# Patient Record
Sex: Female | Born: 1980 | Race: White | Hispanic: No | Marital: Single
Health system: Midwestern US, Community
[De-identification: ages and names within clinical notes are randomized; demographics above are authoritative.]

## PROBLEM LIST (undated history)

## (undated) DIAGNOSIS — E669 Obesity, unspecified: Secondary | ICD-10-CM

## (undated) DIAGNOSIS — F319 Bipolar disorder, unspecified: Secondary | ICD-10-CM

## (undated) DIAGNOSIS — K449 Diaphragmatic hernia without obstruction or gangrene: Secondary | ICD-10-CM

---

## 2014-07-05 ENCOUNTER — Inpatient Hospital Stay
Admit: 2014-07-05 | Discharge: 2014-07-07 | Disposition: A | Discharge status: Other Acute Inpatient Hospital | Payer: Self-pay | Attending: Internal Medicine | Admitting: Internal Medicine

## 2014-07-05 DIAGNOSIS — T391X2A Poisoning by 4-Aminophenol derivatives, intentional self-harm, initial encounter: Secondary | ICD-10-CM

## 2014-07-05 LAB — URINALYSIS W/MICROSCOPIC
Bacteria: NEGATIVE /hpf
Bilirubin: NEGATIVE
Blood: NEGATIVE
Glucose: 100 mg/dL — AB
Ketone: 15 mg/dL — AB
Leukocyte Esterase: NEGATIVE
Nitrites: NEGATIVE
Specific gravity: >1.03 — ABNORMAL HIGH (ref 1.003–1.030)
Urobilinogen: 0.2 EU/dL (ref 0.2–1.0)
pH (UA): 5 (ref 5.0–8.0)

## 2014-07-05 LAB — METABOLIC PANEL, COMPREHENSIVE
A-G Ratio: 0.9 — ABNORMAL LOW (ref 1.1–2.2)
ALT (SGPT): 19 U/L (ref 12–78)
AST (SGOT): 12 U/L — ABNORMAL LOW (ref 15–37)
Albumin: 3.7 g/dL (ref 3.5–5.0)
Alk. phosphatase: 97 U/L (ref 45–117)
Anion gap: 11 mmol/L (ref 5–15)
BUN/Creatinine ratio: 15 (ref 12–20)
BUN: 12 MG/DL (ref 6–20)
Bilirubin, total: 0.4 MG/DL (ref 0.2–1.0)
CO2: 22 mmol/L (ref 21–32)
Calcium: 8.4 MG/DL — ABNORMAL LOW (ref 8.5–10.1)
Chloride: 105 mmol/L (ref 97–108)
Creatinine: 0.78 MG/DL (ref 0.55–1.02)
GFR est AA: >60 mL/min/{1.73_m2} (ref >60)
GFR est non-AA: >60 mL/min/{1.73_m2} (ref >60)
Globulin: 4.1 g/dL — ABNORMAL HIGH (ref 2.0–4.0)
Glucose: 186 mg/dL — ABNORMAL HIGH (ref 65–100)
Potassium: 3.4 mmol/L — ABNORMAL LOW (ref 3.5–5.1)
Protein, total: 7.8 g/dL (ref 6.4–8.2)
Sodium: 138 mmol/L (ref 136–145)

## 2014-07-05 LAB — DRUG SCREEN, URINE
AMPHETAMINES: NEGATIVE
BARBITURATES: NEGATIVE
BENZODIAZEPINES: NEGATIVE
COCAINE: NEGATIVE
METHADONE: NEGATIVE
OPIATES: NEGATIVE
PCP(PHENCYCLIDINE): NEGATIVE
THC (TH-CANNABINOL): POSITIVE — AB

## 2014-07-05 LAB — CBC WITH AUTOMATED DIFF
ABS. BASOPHILS: 0 10*3/uL (ref 0.0–0.1)
ABS. EOSINOPHILS: 0.1 10*3/uL (ref 0.0–0.4)
ABS. LYMPHOCYTES: 2 10*3/uL (ref 0.8–3.5)
ABS. MONOCYTES: 0.5 10*3/uL (ref 0.0–1.0)
ABS. NEUTROPHILS: 7 10*3/uL (ref 1.8–8.0)
BASOPHILS: 0 % (ref 0–1)
EOSINOPHILS: 1 % (ref 0–7)
HCT: 37.9 % (ref 35.0–47.0)
HGB: 12.2 g/dL (ref 11.5–16.0)
LYMPHOCYTES: 21 % (ref 12–49)
MCH: 25.8 PG — ABNORMAL LOW (ref 26.0–34.0)
MCHC: 32.2 g/dL (ref 30.0–36.5)
MCV: 80.1 FL (ref 80.0–99.0)
MONOCYTES: 5 % (ref 5–13)
NEUTROPHILS: 73 % (ref 32–75)
PLATELET: 179 10*3/uL (ref 150–400)
RBC: 4.73 M/uL (ref 3.80–5.20)
RDW: 13.7 % (ref 11.5–14.5)
WBC: 9.5 10*3/uL (ref 3.6–11.0)

## 2014-07-05 LAB — EKG, 12 LEAD, INITIAL
Atrial Rate: 87 {beats}/min
Calculated P Axis: 71 degrees
Calculated R Axis: 54 degrees
Calculated T Axis: 48 degrees
Diagnosis: NORMAL
P-R Interval: 142 ms
Q-T Interval: 394 ms
QRS Duration: 78 ms
QTC Calculation (Bezet): 474 ms
Ventricular Rate: 87 {beats}/min

## 2014-07-05 LAB — CK W/ CKMB & INDEX
CK - MB: <0.5 NG/ML — ABNORMAL LOW (ref 0.5–3.6)
CK: 77 U/L (ref 26–192)

## 2014-07-05 LAB — SED RATE (ESR): Sed rate, automated: 29 mm/hr — ABNORMAL HIGH (ref 0–20)

## 2014-07-05 LAB — MAGNESIUM: Magnesium: 1.5 mg/dL — ABNORMAL LOW (ref 1.6–2.4)

## 2014-07-05 LAB — ACETAMINOPHEN
Acetaminophen level: 227 ug/mL — CR (ref 10–30)
Acetaminophen level: 54 ug/mL — ABNORMAL HIGH (ref 10–30)

## 2014-07-05 LAB — PROTHROMBIN TIME + INR
INR: 1.1 (ref 0.9–1.1)
Prothrombin time: 10.7 s (ref 9.0–11.1)

## 2014-07-05 LAB — HCG URINE, QL. - POC: Pregnancy test,urine (POC): NEGATIVE

## 2014-07-05 LAB — TROPONIN I: Troponin-I, Qt.: <0.04 ng/mL (ref <0.05)

## 2014-07-05 LAB — ETHYL ALCOHOL: ALCOHOL(ETHYL),SERUM: <10 MG/DL (ref <10)

## 2014-07-05 LAB — SALICYLATE: Salicylate level: 1.8 MG/DL — ABNORMAL LOW (ref 2.8–20.0)

## 2014-07-05 LAB — TSH 3RD GENERATION: TSH: 0.79 u[IU]/mL (ref 0.36–3.74)

## 2014-07-05 MED ORDER — ACTIVATED CHARCOAL 25 GRAM/120 ML ORAL SUSP
25 gram/120 mL | ORAL | Status: AC
Start: 2014-07-05 — End: 2014-07-05
  Administered 2014-07-05: 07:00:00 via ORAL

## 2014-07-05 MED ORDER — SODIUM CHLORIDE 0.9% BOLUS IV
0.9 % | INTRAVENOUS | Status: AC
Start: 2014-07-05 — End: 2014-07-05
  Administered 2014-07-05: 08:00:00 via INTRAVENOUS

## 2014-07-05 MED ORDER — NALOXONE 0.4 MG/ML INJECTION
0.4 mg/mL | INTRAMUSCULAR | Status: DC | PRN
Start: 2014-07-05 — End: 2014-07-06

## 2014-07-05 MED ORDER — PANTOPRAZOLE 40 MG IV SOLR
40 mg | Freq: Once | INTRAVENOUS | Status: AC
Start: 2014-07-05 — End: 2014-07-05
  Administered 2014-07-05: 13:00:00 via INTRAVENOUS

## 2014-07-05 MED ORDER — ONDANSETRON (PF) 4 MG/2 ML INJECTION
4 mg/2 mL | INTRAMUSCULAR | Status: AC
Start: 2014-07-05 — End: 2014-07-05
  Administered 2014-07-05: 09:00:00 via INTRAVENOUS

## 2014-07-05 MED ORDER — PANTOPRAZOLE 40 MG TAB, DELAYED RELEASE
40 mg | Freq: Every day | ORAL | Status: DC
Start: 2014-07-05 — End: 2014-07-06
  Administered 2014-07-06: 13:00:00 via ORAL

## 2014-07-05 MED ORDER — SUCRALFATE 100 MG/ML ORAL SUSP
100 mg/mL | Freq: Three times a day (TID) | ORAL | Status: DC | PRN
Start: 2014-07-05 — End: 2014-07-06

## 2014-07-05 MED ORDER — ACETYLCYSTEINE 20 % (200 MG/ML) IV SOLN
200 mg/mL (20 %) | Freq: Once | INTRAVENOUS | Status: AC
Start: 2014-07-05 — End: 2014-07-05
  Administered 2014-07-05: 09:00:00 via INTRAVENOUS

## 2014-07-05 MED ORDER — BISACODYL 5 MG TAB, DELAYED RELEASE
5 mg | Freq: Every day | ORAL | Status: DC | PRN
Start: 2014-07-05 — End: 2014-07-06

## 2014-07-05 MED ORDER — ACETYLCYSTEINE 20 % (200 MG/ML) IV SOLN
20020 mg/mL (20 %) | Freq: Once | INTRAVENOUS | Status: AC
Start: 2014-07-05 — End: 2014-07-06
  Administered 2014-07-05: 14:00:00 via INTRAVENOUS

## 2014-07-05 MED ORDER — ONDANSETRON (PF) 4 MG/2 ML INJECTION
4 mg/2 mL | Freq: Four times a day (QID) | INTRAMUSCULAR | Status: DC | PRN
Start: 2014-07-05 — End: 2014-07-06

## 2014-07-05 MED ORDER — SODIUM CHLORIDE 0.9 % IJ SYRG
INTRAMUSCULAR | Status: DC | PRN
Start: 2014-07-05 — End: 2014-07-06

## 2014-07-05 MED ORDER — SODIUM CHLORIDE 0.9% BOLUS IV
0.9 % | INTRAVENOUS | Status: AC
Start: 2014-07-05 — End: 2014-07-05
  Administered 2014-07-05: 07:00:00 via INTRAVENOUS

## 2014-07-05 MED ORDER — NICOTINE 7 MG/24 HR DAILY PATCH
7 mg/24 hr | Freq: Every day | TRANSDERMAL | Status: DC | PRN
Start: 2014-07-05 — End: 2014-07-06

## 2014-07-05 MED ORDER — DEXTROSE 5% IN WATER (D5W) IV
200 mg/mL (20 %) | Freq: Once | INTRAVENOUS | Status: AC
Start: 2014-07-05 — End: 2014-07-05
  Administered 2014-07-05: 10:00:00 via INTRAVENOUS

## 2014-07-05 MED ORDER — MORPHINE 10 MG/ML INJ SOLUTION
10 mg/ml | INTRAMUSCULAR | Status: DC | PRN
Start: 2014-07-05 — End: 2014-07-06

## 2014-07-05 MED ORDER — SODIUM CHLORIDE 0.9 % IJ SYRG
Freq: Three times a day (TID) | INTRAMUSCULAR | Status: DC
Start: 2014-07-05 — End: 2014-07-06
  Administered 2014-07-05 – 2014-07-06 (×5): via INTRAVENOUS

## 2014-07-05 MED ORDER — SODIUM CHLORIDE 0.9 % IJ SYRG
Freq: Three times a day (TID) | INTRAMUSCULAR | Status: DC
Start: 2014-07-05 — End: 2014-07-05
  Administered 2014-07-05: 10:00:00 via INTRAVENOUS

## 2014-07-05 MED ORDER — SODIUM CHLORIDE 0.9 % IJ SYRG
INTRAMUSCULAR | Status: DC | PRN
Start: 2014-07-05 — End: 2014-07-05

## 2014-07-05 MED FILL — PROTONIX 40 MG INTRAVENOUS SOLUTION: 40 mg | INTRAVENOUS | Qty: 40

## 2014-07-05 MED FILL — ACETADOTE 200 MG/ML (20 %) INTRAVENOUS SOLUTION: 200 mg/mL (20 %) | INTRAVENOUS | Qty: 25

## 2014-07-05 MED FILL — SODIUM CHLORIDE 0.9 % IV: INTRAVENOUS | Qty: 1000

## 2014-07-05 MED FILL — ACETADOTE 200 MG/ML (20 %) INTRAVENOUS SOLUTION: 200 mg/mL (20 %) | INTRAVENOUS | Qty: 50

## 2014-07-05 MED FILL — ONDANSETRON (PF) 4 MG/2 ML INJECTION: 4 mg/2 mL | INTRAMUSCULAR | Qty: 4

## 2014-07-05 MED FILL — ACETADOTE 200 MG/ML (20 %) INTRAVENOUS SOLUTION: 200 mg/mL (20 %) | INTRAVENOUS | Qty: 75

## 2014-07-05 MED FILL — INSTA-CHAR 25 GRAM/120 ML ORAL SUSPENSION: 25 gram/120 mL | ORAL | Qty: 240
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
## 2014-07-05 NOTE — ED Provider Notes (Signed)
HPI Comments: Sabrina Saunders is a 34 y.o. female who presents via EMS to the ED c/o suicidal attempt by taking ~70 tabs of Acetaminophen / Diphenhydramine and Acetaminophen / Caffeine x ~2300 yesterday evening. EMS reports the pt filmed herself "chugging" the bottle of pills with a [facility], prior to a friend calling 911. EMS states the pt became nauseous and vomited PTA in ED. On arrival to ED the Tylenol bottle contained 0/20 pills and the Excedrin bottle contained 54/100 pills. EMS notes pt had a BP of 110/63, heart rate of 86 NSR, and SpO2 100% on room air. Pt notes a hx of suicidal ideations and states, "my head never shuts up". She reports her LMP was ~1 month ago. She specifically denies any fevers, chills, nausea, vomiting, chest pain, shortness of breath, headache, rash, diarrhea, sweating or weight loss.    PCP: No primary care provider on file.    PMHx: Significant for bipolar, manic depression  Social Hx: +tobacco, +EtOH, +Illicit Drugs (marijuana)    There are no other complaints, changes, or physical findings at this time.   Written by Donny Pique, ED Scribe, as dictated by Serafina Mitchell. Zack Seal, MD     The history is provided by the patient and the EMS personnel.        Past Medical History:   Diagnosis Date   ??? Ectopic pregnancy    ??? Depression        History reviewed. No pertinent past surgical history.      History reviewed. No pertinent family history.    History     Social History   ??? Marital Status: SINGLE     Spouse Name: N/A   ??? Number of Children: N/A   ??? Years of Education: N/A     Occupational History   ??? Not on file.     Social History Main Topics   ??? Smoking status: Current Every Day Smoker   ??? Smokeless tobacco: Not on file   ??? Alcohol Use: Yes   ??? Drug Use: Yes     Special: OTC, Marijuana   ??? Sexual Activity: Not on file     Other Topics Concern   ??? Not on file     Social History Narrative   ??? No narrative on file           ALLERGIES: Depakote      Review of Systems    Constitutional: Positive for fatigue. Negative for fever, chills, activity change, appetite change and unexpected weight change.   HENT: Negative.  Negative for congestion, hearing loss, rhinorrhea, sneezing and voice change.    Eyes: Negative.  Negative for pain and visual disturbance.   Respiratory: Negative.  Negative for apnea, cough, choking, chest tightness and shortness of breath.    Cardiovascular: Negative.  Negative for chest pain and palpitations.   Gastrointestinal: Positive for nausea and vomiting. Negative for abdominal pain, diarrhea, blood in stool and abdominal distention.   Genitourinary: Negative.  Negative for urgency, frequency, flank pain and difficulty urinating.        No discharge   Musculoskeletal: Negative.  Negative for myalgias, back pain, arthralgias and neck stiffness.   Skin: Negative.  Negative for color change and rash.   Neurological: Negative.  Negative for dizziness, seizures, syncope, speech difficulty, weakness, numbness and headaches.   Hematological: Negative for adenopathy.   Psychiatric/Behavioral: Positive for suicidal ideas and self-injury. Negative for behavioral problems, dysphoric mood and agitation. The patient is not nervous/anxious.  Filed Vitals:    07/05/14 0236 07/05/14 0332 07/05/14 0345 07/05/14 0400   BP: 118/67 1$RemoveBef'09/71 95/61 99/63 'pVEPQaAhgq$   Pulse: 87 67 69 75   Temp: 98.2 ??F (36.8 ??C)      Resp: $Remo'19 21 21 22   'LpGzv$ Height: $Rem'5\' 4"'PYAN$  (1.626 m)      Weight: 105.3 kg (232 lb 2.3 oz)      SpO2: 92% 100% 99% 100%            Physical Exam   Constitutional: She is oriented to person, place, and time. She appears well-developed and well-nourished. No distress.   HENT:   Head: Normocephalic and atraumatic.   Mouth/Throat: Oropharynx is clear and moist. No oropharyngeal exudate.   Eyes: Conjunctivae and EOM are normal. Pupils are equal, round, and reactive to light. Right eye exhibits no discharge. Left eye exhibits no discharge.   Neck: Normal range of motion. Neck supple.    Cardiovascular: Normal rate, regular rhythm and intact distal pulses.  Exam reveals no gallop and no friction rub.    No murmur heard.  Pulmonary/Chest: Effort normal and breath sounds normal. No respiratory distress. She has no wheezes. She has no rales. She exhibits no tenderness.   Abdominal: Soft. Bowel sounds are normal. She exhibits no distension and no mass. There is no tenderness. There is no rebound and no guarding.   Musculoskeletal: Normal range of motion. She exhibits no edema.   Lymphadenopathy:     She has no cervical adenopathy.   Neurological: She is alert and oriented to person, place, and time. No cranial nerve deficit. Coordination normal.   Skin: Skin is warm and dry. No rash noted. No erythema.   Psychiatric:   Pt appears somnolent, but is arousable.    Nursing note and vitals reviewed.  Written by Donny Pique, ED Scribe, as dictated by Serafina Mitchell. Zack Seal, MD    MDM  Number of Diagnoses or Management Options  Suicide attempt by acetaminophen overdose, initial encounter [facility]):   Diagnosis management comments: DDx: drug overdose, suicidal ideations, suicidal attempt       Amount and/or Complexity of Data Reviewed  Clinical lab tests: reviewed and ordered  Tests in the medicine section of CPT??: ordered and reviewed  Review and summarize past medical records: yes  Discuss the patient with other providers: yes Probation officer, Hospitalist )  Independent visualization of images, tracings, or specimens: yes    Patient Progress  Patient progress: stable      Procedures    Chief Complaint   Patient presents with   ??? Suicidal     per EMS pt ingested approx 70 tabs of Excedrin PM and Tylenol with Caffeine. Pt sleepy, pt states pt took meds with the intention to harm self. Posted video on social media, saw by friend who called [facility]       2:30 AM  The patients presenting problems have been discussed, and they are in agreement with the care plan formulated and outlined with them.  I have  encouraged them to ask questions as they arise throughout their visit.    MEDICATIONS GIVEN:  Medications   sodium chloride (NS) flush 5-10 mL (not administered)   sodium chloride (NS) flush 5-10 mL (not administered)   acetylcysteine (ACETADOTE) 15,000 mg in dextrose 5% 200 mL infusion (not administered)     Followed by   acetylcysteine (ACETADOTE) 5,000 mg in dextrose 5% 500 mL infusion (not administered)     Followed by  acetylcysteine (ACETADOTE) 10,000 mg in dextrose 5% 1,000 mL infusion (not administered)   sodium chloride 0.9 % bolus infusion 1,000 mL (not administered)   activated charcoal (ACTIDOSE) oral suspension 50 g (50 g Oral Given 07/05/14 0238)   sodium chloride 0.9 % bolus infusion 1,000 mL (1,000 mL IntraVENous New Bag 07/05/14 0239)       LABS REVIEWED:  Recent Results (from the past 24 hour(s))   EKG, 12 LEAD, INITIAL    Collection Time: 07/05/14  2:33 AM   Result Value Ref Range    Ventricular Rate 87 BPM    Atrial Rate 87 BPM    P-R Interval 142 ms    QRS Duration 78 ms    Q-T Interval 394 ms    QTC Calculation (Bezet) 474 ms    Calculated P Axis 71 degrees    Calculated R Axis 54 degrees    Calculated T Axis 48 degrees    Diagnosis       Normal sinus rhythm  Right atrial enlargement  No previous ECGs available     CBC WITH AUTOMATED DIFF    Collection Time: 07/05/14  2:45 AM   Result Value Ref Range    WBC 9.5 3.6 - 11.0 K/uL    RBC 4.73 3.80 - 5.20 M/uL    HGB 12.2 11.5 - 16.0 g/dL    HCT 37.9 35.0 - 47.0 %    MCV 80.1 80.0 - 99.0 FL    MCH 25.8 (L) 26.0 - 34.0 PG    MCHC 32.2 30.0 - 36.5 g/dL    RDW 13.7 11.5 - 14.5 %    PLATELET 179 150 - 400 K/uL    NEUTROPHILS 73 32 - 75 %    LYMPHOCYTES 21 12 - 49 %    MONOCYTES 5 5 - 13 %    EOSINOPHILS 1 0 - 7 %    BASOPHILS 0 0 - 1 %    ABS. NEUTROPHILS 7.0 1.8 - 8.0 K/UL    ABS. LYMPHOCYTES 2.0 0.8 - 3.5 K/UL    ABS. MONOCYTES 0.5 0.0 - 1.0 K/UL    ABS. EOSINOPHILS 0.1 0.0 - 0.4 K/UL    ABS. BASOPHILS 0.0 0.0 - 0.1 K/UL   CK W/ CKMB & INDEX     Collection Time: 07/05/14  2:45 AM   Result Value Ref Range    CK 77 26 - 192 U/L    CK - MB <0.5 (L) 0.5 - 3.6 NG/ML    CK-MB Index CANNOT BE CALCULATED 0 - 2.5     METABOLIC PANEL, COMPREHENSIVE    Collection Time: 07/05/14  2:45 AM   Result Value Ref Range    Sodium 138 136 - 145 mmol/L    Potassium 3.4 (L) 3.5 - 5.1 mmol/L    Chloride 105 97 - 108 mmol/L    CO2 22 21 - 32 mmol/L    Anion gap 11 5 - 15 mmol/L    Glucose 186 (H) 65 - 100 mg/dL    BUN 12 6 - 20 MG/DL    Creatinine 0.78 0.55 - 1.02 MG/DL    BUN/Creatinine ratio 15 12 - 20      GFR est AA >60 >60 ml/min/1.84m2    GFR est non-AA >60 >60 ml/min/1.41m2    Calcium 8.4 (L) 8.5 - 10.1 MG/DL    Bilirubin, total 0.4 0.2 - 1.0 MG/DL    ALT 19 12 - 78 U/L    AST 12 (L) 15 -  37 U/L    Alk. phosphatase 97 45 - 117 U/L    Protein, total 7.8 6.4 - 8.2 g/dL    Albumin 3.7 3.5 - 5.0 g/dL    Globulin 4.1 (H) 2.0 - 4.0 g/dL    A-G Ratio 0.9 (L) 1.1 - 2.2     MAGNESIUM    Collection Time: 07/05/14  2:45 AM   Result Value Ref Range    Magnesium 1.5 (L) 1.6 - 2.4 mg/dL   PROTHROMBIN TIME + INR    Collection Time: 07/05/14  2:45 AM   Result Value Ref Range    INR 1.1 0.9 - 1.1      Prothrombin time 10.7 9.0 - 11.1 sec   TROPONIN I    Collection Time: 07/05/14  2:45 AM   Result Value Ref Range    Troponin-I, Qt. <0.04 <0.05 ng/mL   ACETAMINOPHEN    Collection Time: 07/05/14  2:45 AM   Result Value Ref Range    ACETAMINOPHEN 227 (HH) 10 - 30 ug/mL   ETHYL ALCOHOL    Collection Time: 07/05/14  2:45 AM   Result Value Ref Range    ALCOHOL(ETHYL),SERUM <16 <10 MG/DL   SALICYLATE    Collection Time: 07/05/14  2:45 AM   Result Value Ref Range    SALICYLATE 1.8 (L) 2.8 - 20.0 MG/DL   URINALYSIS W/MICROSCOPIC    Collection Time: 07/05/14  3:10 AM   Result Value Ref Range    Color YELLOW/STRAW      Appearance CLEAR CLEAR      Specific gravity >1.030 (H) 1.003 - 1.030    pH (UA) 5.0 5.0 - 8.0      Protein TRACE (A) NEG mg/dL    Glucose 100 (A) NEG mg/dL    Ketone 15 (A) NEG mg/dL     Bilirubin NEGATIVE  NEG      Blood NEGATIVE  NEG      Urobilinogen 0.2 0.2 - 1.0 EU/dL    Nitrites NEGATIVE  NEG      Leukocyte Esterase NEGATIVE  NEG      WBC 0-4 0 - 4 /hpf    RBC 0-5 0 - 5 /hpf    Epithelial cells FEW FEW /lpf    Bacteria NEGATIVE  NEG /hpf    Mucus TRACE (A) NEG /lpf   DRUG SCREEN, URINE    Collection Time: 07/05/14  3:10 AM   Result Value Ref Range    AMPHETAMINE NEGATIVE  NEG      BARBITURATES NEGATIVE  NEG      BENZODIAZEPINE NEGATIVE  NEG      COCAINE NEGATIVE  NEG      METHADONE NEGATIVE  NEG      OPIATES NEGATIVE  NEG      PCP(PHENCYCLIDINE) NEGATIVE  NEG      THC (TH-CANNABINOL) POSITIVE (A) NEG      Drug screen comment (NOTE)    HCG URINE, QL. - POC    Collection Time: 07/05/14  3:49 AM   Result Value Ref Range    Pregnancy test,urine (POC) NEGATIVE  NEG         VITAL SIGNS:  Patient Vitals for the past 12 hrs:   Temp Pulse Resp BP SpO2   07/05/14 0400 - 75 22 99/63 mmHg 100 %   07/05/14 0345 - 69 21 95/61 mmHg 99 %   07/05/14 0332 - 67 21 109/71 mmHg 100 %   07/05/14 0236 98.2 ??F (36.8 ??C) 87 19 118/67  mmHg 92 %       RADIOLOGY RESULTS:  The following have been ordered and reviewed:  No orders to display       EKG interpretation: (Preliminary)(Read 0236)  Rhythm: normal sinus rhythm; and regular . Rate (approx.): 87 bpm; Axis: normal; P wave: normal; QRS interval: normal ; ST/T wave: normal; non-ischemic.  Written by Donny Pique, ED Scribe, as dictated by Serafina Mitchell. Zack Seal, MD    Pulse Oximetry Analysis - Normal 99% on room air    Cardiac Monitor:   Rate: 84   Rhythm: Normal Sinus Rhythm        PROCEDURES:        CONSULTATIONS:     CONSULT NOTE:   3:46 AM  Duran Ohern L. Zack Seal, MD spoke with [place],   Specialty: Poison Control  Discussed pt's hx, disposition, and available diagnostic and imaging results. Reviewed care plans. Consultant recommends supportive care, treatment with NAC, and admission through hospitalist.   Written by Donny Pique, ED Scribe, as dictated by Serafina Mitchell. Zack Seal, MD.     CONSULT NOTE:   4:28 AM  Serafina Mitchell. Zack Seal, MD spoke with Dr. Delane Ginger,   Specialty: Hospitalist  Discussed pt's hx, disposition, and available diagnostic and imaging results. Reviewed care plans. Consultant will evaluate pt for admission.  Written by Donny Pique, ED Scribe, as dictated by Serafina Mitchell. Zack Seal, MD.      PROGRESS NOTES:    2:38 AM  Pt given Activated Charcoal as part of treatment plan.  Written by Donny Pique, ED Scribe, as dictated by Serafina Mitchell. Zack Seal, MD     3:34 AM  Serafina Mitchell. Zack Seal, MD notified pt's Tylenol level is 227.  Written by Donny Pique, ED Scribe, as dictated by Serafina Mitchell. Zack Seal, MD     CRITICAL CARE NOTE :  3:57 AM    IMPENDING DETERIORATION -Metabolic and Hepatic  ASSOCIATED RISK FACTORS - Metabolic changes  MANAGEMENT- Bedside Assessment  INTERPRETATION -  ECG and Blood Pressure  INTERVENTIONS - hemodynamic mngmt and Metobolic interventions  CASE REVIEW - Hospitalist  TREATMENT RESPONSE -Stable  PERFORMED BY - Self    NOTES   :  I have spent 75 minutes of critical care time involved in lab review, consultations with specialist, family decision- making, bedside attention and documentation. During this entire length of time I was immediately available to the patient .  Written by Donny Pique, ED Scribe, as dictated by Serafina Mitchell. Zack Seal, MD    DIAGNOSIS:    1. Suicide attempt by acetaminophen overdose, initial encounter ([place])    2. Bipolar 1 disorder ([place])        PLAN:    1. Admit    ED COURSE: The patients hospital course has been uncomplicated.    4:28 AM   Patient is being admitted to the hospital.  The results of their tests and reasons for their admission have been discussed with them and/or available family.  They convey agreement and understanding for the need to be admitted and for their admission diagnosis.  Consultation has been made  with the inpatient physician specialist for hospitalization.                  This note is prepared by Donny Pique, acting as Scribe for [employer]. Zack Seal, [place] Zack Seal, MD : The scribe's documentation has been prepared under my direction and personally reviewed by me in its entirety. I confirm  that the note above accurately reflects all work, treatment, procedures, and medical decision making performed by me.

## 2014-07-05 NOTE — Assessment & Plan Note (Signed)
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

## 2014-07-05 NOTE — ED Notes (Signed)
Attempted to call report. RN unable to take report at this time, will await call back.

## 2014-07-05 NOTE — Progress Notes (Addendum)
PCU SHIFT NURSING NOTE      Bedside and Verbal shift change report given to Dickie La, Charity fundraiser (Cabin crew) by Fleet Contras, RN (offgoing nurse). Report included the following information SBAR, Kardex, Intake/Output, Recent Results and Cardiac Rhythm SR.    Shift Summary:   Primary Nurse Waylan Rocher and Ivonne Andrew, RN performed a dual skin assessment on this patient No impairment noted  Braden score is 23        Admission Date 07/05/2014   Admission Diagnosis Suicide attempt by multiple drug overdose (HCC)   Consults IP CONSULT TO PSYCHIATRY        Consults   PT   OT   Speech   Case Management       Palliative      Cardiac Monitoring Order   Yes   No     IV drips   Yes    Drip:                            Dose:  Drip:                            Dose:  Drip:                            Dose:   No     GI Prophylaxis   Yes   No         DVT Prophylaxis   SCDs:  Sequential Compression Device: Bilateral          Ted stockings:          Medication   Contraindicated   None      Activity Level           Purposeful Rounding every 1-2 hour?   Yes   Schmidt Score  Total Score: 4   Bed Alarm (If score 3 or >)   Yes    Refused (See signed refusal form in chart)   Braden Score  Braden Score: 23   Braden Score (if score 14 or less)   PMT consult   Wound Care consult      Specialty bed    Nutrition consult          Needs prior to discharge:   Home O2 required:    Yes   No    If yes, how much O2 required?    Other:    Last Bowel Movement:        Influenza Vaccine          Pneumonia Vaccine           Diet Active Orders   Diet    DIET CLEAR LIQUID    DIET REGULAR      LDAs               Peripheral IV 07/05/14 Left Antecubital (Active)   Site Assessment Clean, dry, & intact 07/05/2014  7:26 AM Phlebitis Assessment 0 07/05/2014  7:26 AM   Infiltration Assessment 0 07/05/2014  7:26 AM   Dressing Status Clean, dry, & intact 07/05/2014  7:26 AM   Dressing Type Transparent;Tape 07/05/2014  7:26 AM    Hub Color/Line Status Pink;Flushed 07/05/2014  7:26 AM       Peripheral IV 07/05/14 Left Hand (Active)   Site Assessment Clean, dry, & intact 07/05/2014  7:26 AM   Phlebitis  Assessment 0 07/05/2014  7:26 AM   Infiltration Assessment 0 07/05/2014  7:26 AM   Dressing Status Clean, dry, & intact 07/05/2014  7:26 AM   Dressing Type Transparent;Tape 07/05/2014  7:26 AM   Hub Color/Line Status Pink;Infusing 07/05/2014  7:26 AM                      Urinary Catheter      Intake & Output   Date 07/04/14 0700 - 07/05/14 0659(Not Admitted) 07/05/14 0700 - 07/06/14 0659   Shift 0700-1859 1900-0659 24 Hour Total 0700-1859 1900-0659 24 Hour Total   I  N  T  A  K  E   Shift Total  (mL/kg)         O  U  T  P  U  T   Urine    150  150      Urine Voided    150  150      Urine Occurrence(s)    1 x  1 x    Emesis/NG output  375 375         Emesis  375 375       Shift Total  (mL/kg)  375  (3.6) 375  (3.6) 150  (1.4)  150  (1.4)   NET  -375 -375 -150  -150   Weight (kg)  105.3 105.3 105.3 105.3 105.3         Readmission Risk Assessment Tool Score Low Risk            8       Total Score        4 More than 1 Admission in calendar year    4 Patient Insurance is Medicare, Medicaid or Self Pay        Criteria that do not apply:    Relationship with PCP    Patient Living Status    Patient Length of Stay > 5    Charlson Comorbidity Score       Expected Length of Stay 2d 4h   Actual Length of Stay 0

## 2014-07-05 NOTE — ED Notes (Signed)
Dr. Yousef at bedside for pt evaluation.

## 2014-07-05 NOTE — H&P (Signed)
History and Physical         Pt Name  Sabrina Saunders   Date of Birth Apr 19, 1980   Medical Record Number  [id number]      Age  34 y.o.   PCP None   Admit date:  07/05/2014    Room Number  ER20/20  @ [facility]   Date of Service  07/05/2014     Admission Diagnoses:  Suicide attempt by multiple drug overdose [facility])     Certification: We are admitting Sabrina Saunders 34 y.o. female with a principle diagnosis of Suicide attempt by multiple drug overdose [facility]); this patient also suffers from other comorbidities listed below. I have a high level of concern for repeated attempt, and progression of adverse effects of the drugs  leading to life threatening complications      Assessment and plan:    * Suicide attempt by multiple drug overdose [facility])  This is the primary reason for her admission. The patient had ingested mixed combination of Excedrin and other OTC medications in an attempt to commit suicide. She is noted to have had contemplated such attempts in the past. Pt was treated with Activated charcoal and her acetaminophen level was 227 this morning   ?? Admit to stepdown   ?? Continue and complete N Acetylcysteine protocol as ordered   ?? Check serial tylenol levels Q6H   ?? Check CMP daily - watching LFT   ?? Suicide watch   ?? Psychiatrist consultation today.       Bipolar disorder with depression (HCC)  Pt reports she had stopped taking her medications about a year ago.   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     Tobacco use disorder, mild, abuse  Pt acknowledges smoking three cigarettes per day     Obesity (BMI 30-39.9)  Chronic        Present on Admission:   ??? Suicide attempt by multiple drug overdose (HCC)  ??? Bipolar disorder with depression (HCC)  ??? Insomnia  ??? Cannabis abuse  ??? Tobacco use disorder, mild, abuse  ??? 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   She recently (few weeks ago) moved here in [place] area and she was staying at a Dentist. She is originally from [place]: Pt's brother who lives in [place]. Pt declined to give permission for me to talk to him.      Prophylaxis  SCD's   Discharge Plan: Home w/Family,    There are currently no Active Isolations Payor: SELF PAY / Plan: [facility] SELF PAY / Product Type: Self Pay /    Social issues  Date Comment    07/05/14 No family at her side.       Prognosis  Fair      Subjective Data         History of Present Illness :  The patient recently moved here in [place] She is residing in a motel and on 07/04/14 she was fired from her job and she became acutely depressed and attempted to commit suicide. She did take a video of herself and apparently this video went to her friend who summoned EMS. She was found sick to her stomach when EMS arrived and since then she has received activated charcoal and initial loading dose of Acetylcysteine. When I went to see her the patient was actively vomiting charcoal and cursing profusely with profane words.     She reported to me that she overdosed herself with excedrin and some "other" OTC medicines and she was feeling fine until "they gave the black stuff" (charcoal that made her sick)        Past Medical History   Diagnosis Date   ??? Ectopic pregnancy    ??? Depression        History reviewed. No pertinent past surgical history.      History   Substance Use Topics   ??? Smoking status: Current Every Day Smoker   ??? Smokeless tobacco: Not on file   ??? Alcohol Use: Yes        History reviewed. No pertinent family history.    Review of Systems - History obtained from the patient  General ROS: negative for - chills, fatigue or fever  Psychological ROS: positive for - anxiety, behavioral disorder, depression, mood swings, sleep disturbances and suicidal ideation  negative for - hallucinations or hostility  Ophthalmic ROS: negative for - blurry vision or decreased vision  Respiratory ROS: no cough, shortness of breath, or wheezing  Cardiovascular ROS: no chest pain or dyspnea on exertion  Gastrointestinal ROS: positive for - abdominal pain, heartburn, nausea/vomiting and Hiatal hernia   negative for - appetite loss, blood in stools, change in bowel habits or change in stools  Genito-Urinary ROS: no dysuria, trouble voiding, or hematuria  Musculoskeletal ROS: negative for - gait disturbance or joint pain  Neurological ROS: no TIA or stroke symptoms  Dermatological ROS: negative for - acne, rash or skin lesion changes         Objective data     Comment:  anxious lady lying in ER gurney actively vomiting   Her nurses at her side.      Subjective:        Physical Exam:             Vitals    Visit Vitals   Item Reading   ??? BP 112/70 mmHg   ??? Pulse 85   ??? Temp 98.2 ??F (36.8 ??C)   ??? Resp 17   ??? Ht 5\' 4"  (1.626 m)   ??? Wt 105.3 kg (232 lb 2.3 oz)   ??? BMI 39.83 kg/m2   ??? SpO2 100%    Body mass index is 39.83 kg/(m^2). -  Wt Readings from Last 10 Encounters:   07/05/14 105.3 kg (232 lb 2.3 oz)         General:  Alert, cooperative,   well noursished,   well developed,   appears stated age    Ears/Eyes:  Hearing intact  Sclera anicteric.   Pupils equal   Mouth/Throat:  Mucous membranes normal pink and moist  Signs of activated charcoal noted on her tongue and mouth   Oral pharynx clear    Neck:     Lungs:  Trachea midline  Chest excursion symmetrical   Auscultation B/L Symmetrical with   Vesicular breath  sounds     No Crepitations noted    Percussion note resonant on mid Clavicular line; no sign of pneumothorax        CVS:  Regular rate and rhythm   no  murmur,   No click, rub or gallop  S1 normal   S2 normal   Pedal pulses  b/l symmetrical   Abdomen:  Obese  Soft, non-tender  Bowel sounds normal  No distension   Percussion note tympanitic   Extremities:  No cyanosis, jaundice  No edema noted   No sign of DVT/cord like lesion on palpation  No sign of acute trauma   Age appropriate OA changes noted.    Skin:    Skin color, texture, turgor normal. no acute rash or lesions    Lymph nodes:     Musculoskeletal Muscle bulk B/L symmetrical   Neuro Cranial nerves are intact,   motor movement b/l symmetrical,   Sensory evaluation b/l symmetrical    Psych:  Alert and oriented,    Angry mood, easily frustrated.             Medications reviewed     Current Facility-Administered Medications   Medication Dose Route Frequency   ??? sodium chloride (NS) flush 5-10 mL  5-10 mL IntraVENous Q8H   ??? sodium chloride (NS) flush 5-10 mL  5-10 mL IntraVENous PRN   ??? acetylcysteine (ACETADOTE) 5,000 mg in dextrose 5% 500 mL infusion  47.5 mg/kg IntraVENous ONCE    Followed by   ??? acetylcysteine (ACETADOTE) 10,000 mg in dextrose 5% 1,000 mL infusion  95 mg/kg IntraVENous ONCE   ??? sodium chloride 0.9 % bolus infusion 1,000 mL  1,000 mL IntraVENous NOW     No current outpatient prescriptions on file.       Relevant other informations:     Other medical conditions listed in this following active hospital problem list section; all of these and other pertinent data were taken into consideration when treatment plan is developed and customized to this patient's unique overall circumstances and needs.   Present on Admission:   ??? Suicide attempt by multiple drug overdose (HCC)  ??? Bipolar disorder with depression (HCC)  ??? Insomnia  ??? Cannabis abuse  ??? Tobacco use disorder, mild, abuse  ??? Obesity (BMI 30-39.9)     Data Review:   Recent Days:  Recent Labs      07/05/14   0245   WBC  9.5    HGB  12.2   HCT  37.9   PLT  179     Recent Labs      07/05/14   0245   NA  138   K  3.4*   CL  105   CO2  22   GLU  186*   BUN  12   CREA  0.78   CA  8.4*   MG  1.5*   ALB  3.7   TBILI  0.4   SGOT  12*   ALT  19   INR  1.1     No results found for: TSH, TSHEXT    Care Plan discussed with: Patient/Family, Nurse and Other ER Doctor    Other medical conditions are listed in the active hospital problem list section; these and other pertinent data were taken into consideration when the treatment plan was developed and customized to this patient's unique overall circumstances and needs.    High complexity decision making was performed for this patient who is at  high risk for decompensation with multiple organ involvement.     Today total floor/unit time was 60  minutes while caring for this patient and greater than 50% of that time was spent with patient (and/or family) coordinating patient???s clinical issues.     Gabriel Carina MD MPH  FACP                               07/05/2014

## 2014-07-05 NOTE — Assessment & Plan Note (Signed)
This is the primary reason for her admission. The patient had ingested mixed combination of Excedrin and other OTC medications in an attempt to commit suicide. She is noted to have had contemplated such attempts in the past. Pt was treated with Activated charcoal and her acetaminophen level was 227 this morning   ?? Admit to stepdown   ?? Continue and complete N Acetylcysteine protocol as ordered   ?? Check serial tylenol levels Q6H   ?? Check CMP daily - watching LFT   ?? Suicide watch   ?? Psychiatrist consultation today.

## 2014-07-05 NOTE — ED Notes (Signed)
TRANSFER - OUT REPORT:    Verbal report given to Fleet Contrasachel, RN (name) on Noralyn PickSharla Vazquez  being transferred to PCU (unit) for routine progression of care       Report consisted of patient???s Situation, Background, Assessment and   Recommendations(SBAR).     Information from the following report(s) SBAR, ED Summary, [facility] and Recent Results was reviewed with the receiving nurse.    Lines:   Peripheral IV 07/05/14 Left Antecubital (Active)   Site Assessment Clean, dry, & intact 07/05/2014  3:12 AM   Phlebitis Assessment 0 07/05/2014  3:12 AM   Infiltration Assessment 0 07/05/2014  3:12 AM   Dressing Status Clean, dry, & intact 07/05/2014  3:12 AM       Peripheral IV 07/05/14 Left Hand (Active)   Site Assessment Clean, dry, & intact 07/05/2014  3:12 AM   Phlebitis Assessment 0 07/05/2014  3:12 AM   Infiltration Assessment 0 07/05/2014  3:12 AM   Dressing Status Clean, dry, & intact 07/05/2014  3:12 AM        Opportunity for questions and clarification was provided.      Patient transported with:   Social research officer, governmentMonitor  Registered Nurse  Tech

## 2014-07-05 NOTE — ED Notes (Signed)
Pt presents via EMS to the ED for suicidal attempt. Pt took approximately 70 tabs of Acetaminophen / Diphenhydramine and Acetaminophen / Caffeine around 2300 yesterday evening. EMS reports the pt filmed herself "chugging" the bottle of pills with a [facility] prior to a friend calling 911. On arrival to ED the Tylenol bottle contained 0/20 pills and the Excedrin bottle contained 54/100 pills. EMS reports stable VS. Pt notes a hx of suicidal ideations and states, "my head never shuts up". Pt has not been compliant with psych medications, unsure by pt account what medications is supposed to be taking. Pt placed on monitor x3, Dr. Hinton RaoStadler at bedside. 1:1 pt observation initiated for SAD scale of 10.

## 2014-07-05 NOTE — Assessment & Plan Note (Signed)
Pt acknowledges smoking three cigarettes per day

## 2014-07-05 NOTE — Assessment & Plan Note (Signed)
Chronic. 

## 2014-07-05 NOTE — ED Notes (Addendum)
Pt incontinent of urine. Asking to walk to bathroom, explained to pt she is not steady enough to do so at this time, pt placed on bedpan. Spoke with pharmacy about sending next bag of Acetadote, will send ASAP.

## 2014-07-05 NOTE — Assessment & Plan Note (Signed)
Chronic and helped more by cannabis

## 2014-07-05 NOTE — Progress Notes (Signed)
PCU SHIFT NURSING NOTE      Bedside and Verbal shift change report given to Darden Amber. RN (oncoming nurse) by Darden Amber RN (offgoing nurse). Report included the following information SBAR, Kardex and Recent Results.    Shift Summary:       Admission Date 07/05/2014   Admission Diagnosis Suicide attempt by multiple drug overdose (HCC)   Consults IP CONSULT TO PSYCHIATRY        Consults   PT   OT   Speech   Case Management       Palliative Psych     Cardiac Monitoring Order   Yes   No     IV drips   Yes    Drip:                            Dose:  Drip:                            Dose:  Drip:                            Dose:   No     GI Prophylaxis   Yes   No         DVT Prophylaxis   SCDs:  Sequential Compression Device: Bilateral          Ted stockings:          Medication   Contraindicated   None      Activity Level Activity Level: Up with Assistance     Activity Assistance: Partial (one person)   Purposeful Rounding every 1-2 hour?   Yes   Schmidt Score  Total Score: 4   Bed Alarm (If score 3 or >)   Yes    Refused (See signed refusal form in chart)   Braden Score  Braden Score: 23   Braden Score (if score 14 or less)   PMT consult   Wound Care consult      Specialty bed    Nutrition consult          Needs prior to discharge:   Home O2 required:    Yes   No    If yes, how much O2 required?    Other:    Last Bowel Movement:        Influenza Vaccine          Pneumonia Vaccine           Diet Active Orders   Diet    DIET REGULAR      LDAs               Peripheral IV 07/05/14 Left Antecubital (Active)   Site Assessment Clean, dry, & intact 07/05/2014  7:55 PM   Phlebitis Assessment 0 07/05/2014  7:55 PM   Infiltration Assessment 0 07/05/2014  7:55 PM   Dressing Status Clean, dry, & intact;Intact 07/05/2014  7:55 PM   Dressing Type Tape;Transparent 07/05/2014  7:55 PM   Hub Color/Line Status Pink;Capped 07/05/2014  7:55 PM       Peripheral IV 07/05/14 Left Hand (Active)    Site Assessment Clean, dry, & intact 07/05/2014  7:55 PM   Phlebitis Assessment 0 07/05/2014  7:55 PM   Infiltration Assessment 0 07/05/2014  7:55 PM   Dressing Status Clean, dry, & intact 07/05/2014  7:55 PM  Dressing Type Tape;Transparent 07/05/2014  7:55 PM   Hub Color/Line Status Pink;Infusing 07/05/2014  7:55 PM                      Urinary Catheter      Intake & Output   Date 07/05/14 0700 - 07/06/14 0659 07/06/14 0700 - 07/07/14 0659   Shift 0700-1859 1900-0659 24 Hour Total 0700-1859 1900-0659 24 Hour Total   I  N  T  A  K  E   I.V.  (mL/kg/hr) 558.7  (0.4) 454.8 1013.5       Volume (acetylcysteine (ACETADOTE) 10,000 mg in dextrose 5% 1,000 mL infusion) 558.7 454.8 1013.5       Shift Total  (mL/kg) 558.7  (5.3) 454.8  (4.3) 1013.5  (9.6)      O  U  T  P  U  T   Urine  (mL/kg/hr) 300  (0.2)  300         Urine Voided 300  300         Urine Occurrence(s) 1 x  1 x       Shift Total  (mL/kg) 300  (2.8)  300  (2.8)      NET 258.7 454.8 713.5      Weight (kg) 105.3 105.3 105.3 105.3 105.3 105.3         Readmission Risk Assessment Tool Score Low Risk            8       Total Score        4 More than 1 Admission in calendar year    4 Patient Insurance is Medicare, Medicaid or Self Pay        Criteria that do not apply:    Relationship with PCP    Patient Living Status    Patient Length of Stay > 5    Charlson Comorbidity Score       Expected Length of Stay 2d 4h   Actual Length of Stay 1

## 2014-07-05 NOTE — Progress Notes (Signed)
CM met with patient for initial assessment.  Pt name and dob as identifier.  Pt is a self pay.  Pt recently moved to [place] and is living at a [employer] in [place].  Pt updated phone contact and emergency contacts.      Pt reports that her support network consists of co-workers.  Pt denies use of DME.  Pt has no previous experience with HH/SNF.      Pt does not drive and is dependent on others to provide transportation.  Pt states she does not have a driver's license.      Pt does not have a local pharmacy at this time.      Pt was given a copy of Immunologist and a list of [place].      CM will update emergency contacts in chart.  CM will continue to follow for discharge needs.    Care Management Interventions  PCP Verified by CM: No  Mode of Transport at Discharge: Other (see comment) (pt does not drive/ friends)  Transition of Care Consult (CM Consult): Discharge Planning  Discharge Durable Medical Equipment: No  Physical Therapy Consult: No  Occupational Therapy Consult: No  Speech Therapy Consult: No  Current Support Network: Other, Lives Alone (pt lives in a motel)  Plan discussed with Pt/Family/Caregiver: No  Discharge Location  Discharge Placement:  (anticipating inpatient psych)    Harriet Masson, RN CM  Ext [phone]

## 2014-07-05 NOTE — Assessment & Plan Note (Signed)
Pt reports she had stopped taking her medications about a year ago.   She has resorted to taking the cannabis every night that helps her sleep and stabilized her  Mood   She was prescribed seroquel+ Vistaril+ trazodone etc in the past

## 2014-07-05 NOTE — ED Notes (Signed)
Pt complaining of nausea and dry heaving. Dr. Hinton RaoStadler notified. Pt incontinent of urine. Sheets changed, pt repositioned for comfort.

## 2014-07-05 NOTE — ED Notes (Signed)
Empty bottle of Headache PM and bottle with pills of Excedrin disposed of in sharps container as they posed a safety hazard for pt.

## 2014-07-05 NOTE — ED Notes (Signed)
Officer Andria MeuseStevens with [employer] reports pt has a warrant out for her arrest in [place]. States their department should be notified of pt's discharge.

## 2014-07-05 NOTE — Progress Notes (Addendum)
PCU SHIFT NURSING NOTE      Bedside and Verbal shift change report given to Toni Amendourtney (Cabin crewoncoming nurse) by Dickie Laoby (offgoing nurse). Report included the following information SBAR, Kardex, ED Summary, Procedure Summary, Intake/Output, MAR and Recent Results.    Shift Summary:   1520 pt requesting i talk to her and her "boss, which is my better half" on speaker phone. Informed pt of status and care plan while her boss was on the phone. He requested to know visiting hours and when she would be coming home, informed him that would depend on the care plan and length of stay in the psych facility      Admission Date 07/05/2014   Admission Diagnosis Suicide attempt by multiple drug overdose (HCC)   Consults IP CONSULT TO PSYCHIATRY        Consults   PT   OT   Speech   Case Management       Palliative      Cardiac Monitoring Order   Yes   No     IV drips   Yes    Drip:                            Dose:  Drip:                            Dose:  Drip:                            Dose:   No     GI Prophylaxis   Yes   No         DVT Prophylaxis   SCDs:  Sequential Compression Device: Bilateral          Ted stockings:          Medication   Contraindicated   None      Activity Level           Purposeful Rounding every 1-2 hour?   Yes   Schmidt Score  Total Score: 4   Bed Alarm (If score 3 or >)   Yes    Refused (See signed refusal form in chart)   Braden Score  Braden Score: 23   Braden Score (if score 14 or less)   PMT consult   Wound Care consult      Specialty bed    Nutrition consult          Needs prior to discharge:   Home O2 required:    Yes   No    If yes, how much O2 required?    Other:    Last Bowel Movement:        Influenza Vaccine          Pneumonia Vaccine           Diet Active Orders   Diet    DIET CLEAR LIQUID    DIET REGULAR      LDAs               Peripheral IV 07/05/14 Left Antecubital (Active)   Site Assessment Clean, dry, & intact 07/05/2014  7:26 AM   Phlebitis Assessment 0 07/05/2014  7:26 AM    Infiltration Assessment 0 07/05/2014  7:26 AM   Dressing Status Clean, dry, & intact 07/05/2014  7:26 AM   Dressing Type Transparent;Tape 07/05/2014  7:26 AM  Hub Color/Line Status Pink;Flushed 07/05/2014  7:26 AM       Peripheral IV 07/05/14 Left Hand (Active)   Site Assessment Clean, dry, & intact 07/05/2014  7:26 AM   Phlebitis Assessment 0 07/05/2014  7:26 AM   Infiltration Assessment 0 07/05/2014  7:26 AM   Dressing Status Clean, dry, & intact 07/05/2014  7:26 AM   Dressing Type Transparent;Tape 07/05/2014  7:26 AM   Hub Color/Line Status Pink;Infusing 07/05/2014  7:26 AM                      Urinary Catheter      Intake & Output   Date 07/04/14 0700 - 07/05/14 0659(Not Admitted) 07/05/14 0700 - 07/06/14 0659   Shift 0700-1859 1900-0659 24 Hour Total 0700-1859 1900-0659 24 Hour Total   I  N  T  A  K  E   Shift Total  (mL/kg)         O  U  T  P  U  T   Urine    300  300      Urine Voided    300  300      Urine Occurrence(s)    1 x  1 x    Emesis/NG output  375 375         Emesis  375 375       Shift Total  (mL/kg)  375  (3.6) 375  (3.6) 300  (2.8)  300  (2.8)   NET  -375 -375 -300  -300   Weight (kg)  105.3 105.3 105.3 105.3 105.3         Readmission Risk Assessment Tool Score Low Risk            8       Total Score        4 More than 1 Admission in calendar year    4 Patient Insurance is Medicare, Medicaid or Self Pay        Criteria that do not apply:    Relationship with PCP    Patient Living Status    Patient Length of Stay > 5    Charlson Comorbidity Score       Expected Length of Stay 2d 4h   Actual Length of Stay 0

## 2014-07-06 DIAGNOSIS — F319 Bipolar disorder, unspecified: Secondary | ICD-10-CM

## 2014-07-06 LAB — ACETAMINOPHEN
Acetaminophen level: <2 ug/mL — ABNORMAL LOW (ref 10–30)
Acetaminophen level: 2 ug/mL — ABNORMAL LOW (ref 10–30)

## 2014-07-06 LAB — CBC WITH AUTOMATED DIFF
ABS. BASOPHILS: 0 10*3/uL (ref 0.0–0.1)
ABS. EOSINOPHILS: 0.1 10*3/uL (ref 0.0–0.4)
ABS. LYMPHOCYTES: 2.6 10*3/uL (ref 0.8–3.5)
ABS. MONOCYTES: 0.3 10*3/uL (ref 0.0–1.0)
ABS. NEUTROPHILS: 2.9 10*3/uL (ref 1.8–8.0)
BASOPHILS: 1 % (ref 0–1)
EOSINOPHILS: 2 % (ref 0–7)
HCT: 32.7 % — ABNORMAL LOW (ref 35.0–47.0)
HGB: 10.6 g/dL — ABNORMAL LOW (ref 11.5–16.0)
LYMPHOCYTES: 44 % (ref 12–49)
MCH: 25.9 PG — ABNORMAL LOW (ref 26.0–34.0)
MCHC: 32.4 g/dL (ref 30.0–36.5)
MCV: 80 FL (ref 80.0–99.0)
MONOCYTES: 5 % (ref 5–13)
NEUTROPHILS: 48 % (ref 32–75)
PLATELET: 145 10*3/uL — ABNORMAL LOW (ref 150–400)
RBC: 4.09 M/uL (ref 3.80–5.20)
RDW: 13.8 % (ref 11.5–14.5)
WBC: 6 10*3/uL (ref 3.6–11.0)

## 2014-07-06 LAB — METABOLIC PANEL, BASIC
Anion gap: 9 mmol/L (ref 5–15)
BUN/Creatinine ratio: 11 — ABNORMAL LOW (ref 12–20)
BUN: 6 MG/DL (ref 6–20)
CO2: 24 mmol/L (ref 21–32)
Calcium: 7.9 MG/DL — ABNORMAL LOW (ref 8.5–10.1)
Chloride: 109 mmol/L — ABNORMAL HIGH (ref 97–108)
Creatinine: 0.57 MG/DL (ref 0.55–1.02)
GFR est AA: >60 mL/min/{1.73_m2} (ref >60)
GFR est non-AA: >60 mL/min/{1.73_m2} (ref >60)
Glucose: 148 mg/dL — ABNORMAL HIGH (ref 65–100)
Potassium: 3.1 mmol/L — ABNORMAL LOW (ref 3.5–5.1)
Sodium: 142 mmol/L (ref 136–145)

## 2014-07-06 LAB — HEPATIC FUNCTION PANEL
A-G Ratio: 0.8 — ABNORMAL LOW (ref 1.1–2.2)
ALT (SGPT): 21 U/L (ref 12–78)
AST (SGOT): 9 U/L — ABNORMAL LOW (ref 15–37)
Albumin: 2.9 g/dL — ABNORMAL LOW (ref 3.5–5.0)
Alk. phosphatase: 72 U/L (ref 45–117)
Bilirubin, direct: <0.1 MG/DL (ref 0.0–0.2)
Bilirubin, total: 0.2 MG/DL (ref 0.2–1.0)
Globulin: 3.7 g/dL (ref 2.0–4.0)
Protein, total: 6.6 g/dL (ref 6.4–8.2)

## 2014-07-06 MED ORDER — PANTOPRAZOLE 40 MG TAB, DELAYED RELEASE
40 mg | ORAL_TABLET | Freq: Every day | ORAL | Status: DC
Start: 2014-07-06 — End: 2014-07-07

## 2014-07-06 MED ORDER — IBUPROFEN 400 MG TAB
400 mg | Freq: Once | ORAL | Status: AC
Start: 2014-07-06 — End: 2014-07-06
  Administered 2014-07-06: via ORAL

## 2014-07-06 MED ORDER — BISACODYL 5 MG TAB, DELAYED RELEASE
5 mg | ORAL_TABLET | ORAL | Status: DC
Start: 2014-07-06 — End: 2014-07-07

## 2014-07-06 MED ORDER — POTASSIUM CHLORIDE SR 10 MEQ TAB
10 mEq | ORAL_TABLET | ORAL | Status: DC
Start: 2014-07-06 — End: 2014-07-07

## 2014-07-06 MED ORDER — POTASSIUM CHLORIDE SR 10 MEQ TAB
10 mEq | ORAL | Status: AC
Start: 2014-07-06 — End: 2014-07-06
  Administered 2014-07-06: 19:00:00 via ORAL

## 2014-07-06 MED ORDER — SUCRALFATE 100 MG/ML ORAL SUSP
100 mg/mL | Freq: Three times a day (TID) | ORAL | Status: DC | PRN
Start: 2014-07-06 — End: 2014-07-11

## 2014-07-06 MED ORDER — NICOTINE 7 MG/24 HR DAILY PATCH
7 mg/24 hr | MEDICATED_PATCH | Freq: Every day | TRANSDERMAL | Status: DC | PRN
Start: 2014-07-06 — End: 2014-07-11

## 2014-07-06 MED FILL — POTASSIUM CHLORIDE SR 10 MEQ TAB: 10 mEq | ORAL | Qty: 3

## 2014-07-06 MED FILL — BD POSIFLUSH NORMAL SALINE 0.9 % INJECTION SYRINGE: INTRAMUSCULAR | Qty: 10

## 2014-07-06 MED FILL — PROTONIX 40 MG TABLET,DELAYED RELEASE: 40 mg | ORAL | Qty: 1

## 2014-07-06 MED FILL — IBUPROFEN 400 MG TAB: 400 mg | ORAL | Qty: 1

## 2014-07-06 NOTE — Behavioral Health Treatment Team (Signed)
TRANSFER - IN REPORT:    Verbal report received from Cassie(name) on Noralyn PickSharla Mathies  being received from [facility](unit) for routine progression of care      Report consisted of patient???s Situation, Background, Assessment and   Recommendations(SBAR).     Information from the following report(s) SBAR was reviewed with the receiving nurse.    Opportunity for questions and clarification was provided.

## 2014-07-06 NOTE — Discharge Summary (Signed)
Hospitalist Discharge Summary     Patient ID:  Sabrina Saunders  [id number]  [age] yNoralyn Saunders.o.  12/26/1980    PCP on record: None    Admit date: 07/05/2014  Discharge date and time: 07/06/2014      DISCHARGE DIAGNOSIS:    Suicide attempt by multiple drug overdose acetaminophen and excedrin  in ER Activated charcoal with acetaminophen level  227??[name].Mucomyst protocol completed, no lft elevatetions, appreciate psychiatry. Discharge to inpatient psychiatry prior to home discharge.      Bipolar disorder with depression (HCC) and noncompliance and insomnia.  stopped taking her medications about a year ago. uses cannabis to help her sleep and stabilized her?? Mood ??  on seroquel+ Vistaril+ trazodone etc in the past     Cannabis abuse  pt perceives that it helps her sleep and rest better, and reduces stress of travelling salesperson.     Tobacco use disorder, mild, abuse  Pt acknowledges smoking three cigarettes per day     Obesity (BMI 30-39.9)  Chronic     Medications: Per above medication reconciliation.    Pain Management: per above medications    Recommended diet: 500cal/24hour a day calorie reduction    Recommended activity: 30minutes aerobic exercise a day as tolerated.    Full code        CONSULTATIONS:  IP CONSULT TO PSYCHIATRY    Excerpted HPI from H&P of Sabrina CarinaHasan M Yousuf, MD:  She is residing in a motel and on 07/04/14 she was fired from her job and she became acutely depressed and attempted to commit suicide. She did take a video of herself and apparently this video went to her friend who summoned EMS. She was found sick to her stomach when EMS arrived and since then she has received activated charcoal and initial loading dose of Acetylcysteine. When I went to see her the patient was actively vomiting charcoal and cursing profusely with profane words.   _______________________________________________________________________  Patient seen and examined by me on discharge day.  Pertinent Findings:   Gen:   Constitutional: She is oriented to person, place, and time. She appears well-developed and well-nourished. No distress.   HENT: ??  Head: Normocephalic and atraumatic. ??  Mouth/Throat:, no stridor, No oropharyngeal exudate.   Eyes: Conjunctivae are normal.  Right eye exhibits no discharge. Left eye exhibits no discharge.   Neck: Normal range of motion. Trachea midline   Cardiovascular: Normal rate, regular rhythm and intact distal pulses.?? Exam reveals no gallop and no friction rub.?? ??  No murmur heard.  Pulmonary/Chest: Effort normal and breath sounds normal. No respiratory distress. Expiration phase is not prolonged. Abdominal: Soft. Bowel sounds are normal. She exhibits no distension and no mass. There is no tenderness. There is no rebound and no guarding.   Musculoskeletal: Normal range of motion. She exhibits no edema.   Lymphadenopathy: ?? She has no cervical adenopathy.   Neurological: She is alert and oriented to person, place, and time. No cranial nerve deficit. Coordination normal.   Skin: Skin is warm and dry. No rash noted. No erythema.   Psychiatric: insight is good on discharge day, agreeable to inpatient psych.  _______________________________________________________________________  DISCHARGE MEDICATIONS:   Current Discharge Medication List      START taking these medications    Details   bisacodyl (DULCOLAX) 5 mg EC tablet Take 1 Tab by mouth now for 1 dose.  Qty: 1 Tab, Refills: 0      nicotine (NICODERM CQ) 7 mg/24 hr 1 Patch by TransDERmal  route daily as needed (Tobacco crave) for up to 30 days.  Qty: 30 Patch, Refills: 0      pantoprazole (PROTONIX) 40 mg tablet Take 1 Tab by mouth Daily (before breakfast).  Qty: 30 Tab, Refills: 0      potassium chloride SR (KLOR-CON 10) 10 mEq tablet Take 3 Tabs by mouth now for 1 dose.  Qty: 3 Tab, Refills: 0      sucralfate (CARAFATE) 100 mg/mL suspension Take 10 mL by mouth three (3) times daily as needed (indigestion).  Qty: 414 mL, Refills: 0              500cal a day reduction, Recommended Diet    Aerobic a day Activity    _______________________________________________________________________  DISPOSITION:    Home with Family:    Home with HH/PT/OT/RN:    SNF/LTC:    SA[facility]:    OTHER: x   Inpatient psych.    Condition at Discharge:  Stable  _______________________________________________________________________  Follow up with:   PCP : None  Follow-up Information     Follow up With Details Comments Contact Info    None   None ([phone]) Patient stated that they have no PCP          Follow up per inpatient psych. Case management for [employer] for psychotropic medication management and therapeutic services (? CM to come to pt's place of living-pt does not have a car/drive)      Total time in minutes spent coordinating this discharge (includes going over instructions, follow-up, prescriptions, and preparing report for sign off to her PCP) :   39 minutes    Signed:  Elease Hashimoto L. Jordan Likes, DO

## 2014-07-06 NOTE — Progress Notes (Addendum)
Hospitalist answering service notified of need for return call from MD to notify of consult.    2212  Hospitalist notified of consult.

## 2014-07-06 NOTE — Progress Notes (Signed)
Skin Assessment performed by: MF, RN and S.T., RN    Pt has a large bruise behind her left knee.  She has a bruise on her right inner arm.    Pressure Ulcer Documentation  (COMPLETE ONE LABEL PER PRESSURE ULCER)  For further information, please review corresponding Wound Care flowsheet.      Sabrina Saunders has:    No pressure ulcer noted and pressure ulcer prevention initiated.

## 2014-07-06 NOTE — Behavioral Health Treatment Team (Signed)
Belongings:   At bedside: one pair of black stretch pants, two shirts, one bra.     Locked in 7 west closet: one pair of tennis shoes, one pair of socks and toiletries in bin.     Locked in med room: cell phone and one pack of cigarettes.

## 2014-07-06 NOTE — Behavioral Health Treatment Team (Signed)
Patient admitted voluntarily to General Psychiatry, under the services of Dr.Khan.  Patient currently denies suicidal ideation.  PTAm, pt overdosed on Tylenol and Exedrine in an atten=mpt to harm herself.  [facility].  Patient denies homicidal ideation.  Patient verbally contracts for safety.  Patient denies psychotic symptoms.  Pt denies ETOH use.  Pt admits to drug use, including THC.

## 2014-07-06 NOTE — Progress Notes (Addendum)
1539 - EMTALA  [facility] will be Dr. Terrence Duponturre Khan.  Requested pick up time is 6pm with Medical Transport.  Room [facility] is 729-B [facility] Herbert PunKris [facility]  RN to be informed that # for report is [phone].    Care Management Interventions  PCP Verified by CM: No  Mode of Transport at Discharge: BLS Teacher, early years/pre(Medical Transport EMTALA)  Transition of Care Consult (CM Consult): Other [facility] Inpatient Psych)  Discharge Durable Medical Equipment: No  Physical Therapy Consult: No  Occupational Therapy Consult: No  Speech Therapy Consult: No  Current Support Network: Other, Lives Alone (pt lives in a motel)  Plan discussed with Pt/Family/Caregiver: Yes (pt verbalized willingness to go to inpatient psych )  Discharge Location  Discharge Placement: Psychiatric Unit [facility] Inpatient Psych)     Reynaldo MiniumAngela M Mastrovito, RN CM  Ext [phone]     1345 - Robby Sermonhris Kilgore [facility] call.    Reviewed chart.  Thayer OhmChris will call back with bed availability.    CM reviewed disharge order for inpatient psych with patient.  Patient agrees with plan of care.  "No sense in fighting it, I might as well get back on my meds."      [facility]  Left message on voicemail.    Reynaldo MiniumAngela M Mastrovito, RN CM  Ext [phone]

## 2014-07-06 NOTE — Progress Notes (Signed)
PCU SHIFT NURSING NOTE      Bedside and Verbal shift change report given to Irving Burton, RN (oncoming nurse) by Wilford Sports, RN (offgoing nurse). Report included the following information SBAR, Kardex, ED Summary, Intake/Output, MAR and Recent Results.    Shift Summary: 7306    2010: Medical transport at bedside to transport pt to inpatient psych @ [facility]. VSS. 2 IV's removed. Asher Muir, nursing supervisor, at bedside to sign emtala paperwork. All pt belongings sent w/ patient.    Admission Date 07/05/2014   Admission Diagnosis Suicide attempt by multiple drug overdose (HCC)   Consults IP CONSULT TO PSYCHIATRY        Consults   PT   OT   Speech   Case Management       Palliative      Cardiac Monitoring Order   Yes   No     IV drips   Yes    Drip:                            Dose:  Drip:                            Dose:  Drip:                            Dose:   No     GI Prophylaxis   Yes   No         DVT Prophylaxis   SCDs:  Sequential Compression Device: Bilateral          Ted stockings:          Medication   Contraindicated   None      Activity Level Activity Level: Up with Assistance     Activity Assistance: Partial (one person)   Purposeful Rounding every 1-2 hour?   Yes   Schmidt Score  Total Score: 3   Bed Alarm (If score 3 or >)   Yes    Refused (See signed refusal form in chart)   Braden Score  Braden Score: 21   Braden Score (if score 14 or less)   PMT consult   Wound Care consult      Specialty bed    Nutrition consult          Needs prior to discharge:   Home O2 required:    Yes   No    If yes, how much O2 required?    Other:    Last Bowel Movement: Last Bowel Movement Date:  (PTA per pt)      Influenza Vaccine          Pneumonia Vaccine           Diet Active Orders   Diet    DIET REGULAR      LDAs                                 Urinary Catheter      Intake & Output   Date 07/05/14 1900 - 07/06/14 0659 07/06/14 0700 - 07/07/14 0659   Shift 1900-0659 24 Hour Total 0700-1859 1900-0659 24 Hour Total   I  N  T  A  K  E    I.V.  (mL/kg/hr) 454.8 1013.5  Volume (acetylcysteine (ACETADOTE) 10,000 mg in dextrose 5% 1,000 mL infusion) 454.8 1013.5       Shift Total  (mL/kg) 454.8  (4.3) 1013.5  (9.6)      O  U  T  P  U  T   Urine  (mL/kg/hr)  300         Urine Voided  300         Urine Occurrence(s)  1 x 2 x  2 x    Shift Total  (mL/kg)  300  (2.8)      NET 454.8 713.5      Weight (kg) 105.3 105.3 105.3 105.3 105.3         Readmission Risk Assessment Tool Score Low Risk            8       Total Score        4 More than 1 Admission in calendar year    4 Patient Insurance is Medicare, Medicaid or Self Pay        Criteria that do not apply:    Relationship with PCP    Patient Living Status    Patient Length of Stay > 5    Charlson Comorbidity Score       Expected Length of Stay 2d 4h   Actual Length of Stay 1

## 2014-07-06 NOTE — Progress Notes (Addendum)
PCU SHIFT NURSING NOTE      Bedside shift change report given to State Farmttkisson RN (oncoming nurse) by Roxan Hockeyobinson RN (offgoing nurse). Report included the following information SBAR, Kardex, Intake/Output, MAR and Recent Results.    Shift Summary: 1610  [id number]  1635- [facility] Awaiting transport.    1830- Medical transport has not arived at scheduled time. Spoke with medical transport, will be arriving in 45 minutes.    1945- Bedside shift change report given to E.Katrinka BlazingSmith RN (Cabin crewoncoming nurse) by Bess KindsAttkisson RN (offgoing nurse). Report included the following information SBAR, Kardex, Intake/Output, MAR and Recent Results.       Admission Date 07/05/2014   Admission Diagnosis Suicide attempt by multiple drug overdose (HCC)   Consults IP CONSULT TO PSYCHIATRY        Consults   PT   OT   Speech   Case Management       Palliative      Cardiac Monitoring Order   Yes   No     IV drips   Yes    Drip:                            Dose:  Drip:                            Dose:  Drip:                            Dose:   No     GI Prophylaxis   Yes   No         DVT Prophylaxis   SCDs:  Sequential Compression Device: Bilateral          Ted stockings:          Medication   Contraindicated   None      Activity Level Activity Level: Up with Assistance     Activity Assistance: Partial (one person)   Purposeful Rounding every 1-2 hour?   Yes   Schmidt Score  Total Score: 3   Bed Alarm (If score 3 or >)   Yes    Refused (See signed refusal form in chart)   Braden Score  Braden Score: 21   Braden Score (if score 14 or less)   PMT consult   Wound Care consult      Specialty bed    Nutrition consult          Needs prior to discharge:   Home O2 required:    Yes   No    If yes, how much O2 required?    Other:    Last Bowel Movement: Last Bowel Movement Date:  (patient doesn't know)      Influenza Vaccine          Pneumonia Vaccine           Diet Active Orders   Diet    DIET REGULAR      LDAs                Peripheral IV 07/05/14 Left Antecubital (Active)   Site Assessment Clean;Dry 07/06/2014  8:21 AM   Phlebitis Assessment 0 07/06/2014  8:21 AM   Infiltration Assessment 0 07/06/2014  8:21 AM   Dressing Status Clean, dry, & intact 07/06/2014  8:21 AM   Dressing Type Tape;Transparent 07/06/2014  8:21 AM   Hub Color/Line Status Pink;Capped 07/06/2014  8:21 AM       Peripheral IV 07/05/14 Left Hand (Active)   Site Assessment Clean, dry, & intact 07/06/2014  8:21 AM   Phlebitis Assessment 0 07/06/2014  8:21 AM   Infiltration Assessment 0 07/06/2014  8:21 AM   Dressing Status Clean, dry, & intact 07/06/2014  8:21 AM   Dressing Type Tape;Transparent 07/06/2014  8:21 AM   Hub Color/Line Status Pink;Capped 07/06/2014  8:21 AM                      Urinary Catheter      Intake & Output   Date 07/05/14 0700 - 07/06/14 0659 07/06/14 0700 - 07/07/14 0659   Shift 0700-1859 1900-0659 24 Hour Total 0700-1859 1900-0659 24 Hour Total   I  N  T  A  K  E   I.V.  (mL/kg/hr) 558.7  (0.4) 454.8  (0.4) 1013.5  (0.4)         Volume (acetylcysteine (ACETADOTE) 10,000 mg in dextrose 5% 1,000 mL infusion) 558.7 454.8 1013.5       Shift Total  (mL/kg) 558.7  (5.3) 454.8  (4.3) 1013.5  (9.6)      O  U  T  P  U  T   Urine  (mL/kg/hr) 300  (0.2)  300  (0.1)         Urine Voided 300  300         Urine Occurrence(s) 1 x  1 x 1 x  1 x    Shift Total  (mL/kg) 300  (2.8)  300  (2.8)      NET 258.7 454.8 713.5      Weight (kg) 105.3 105.3 105.3 105.3 105.3 105.3         Readmission Risk Assessment Tool Score Low Risk            8       Total Score        4 More than 1 Admission in calendar year    4 Patient Insurance is Medicare, Medicaid or Self Pay        Criteria that do not apply:    Relationship with PCP    Patient Living Status    Patient Length of Stay > 5    Charlson Comorbidity Score       Expected Length of Stay 2d 4h   Actual Length of Stay 1

## 2014-07-06 NOTE — Progress Notes (Addendum)
PCU SHIFT NURSING NOTE      Bedside shift change report given to Clydie BraunKaren RN (oncoming nurse) by Farley Lyachel RN (offgoing nurse). Report included the following information SBAR, Kardex, Intake/Output, MAR and Recent Results.    Shift Summary:   16100815- Patient denies pain, SOB, or nausea. She is on her period and has few back cramps. Sleepy.  1130- Patient sleeping. VSS. Sitter remains at bedside. SCD's applied. Patient has large purple bruise upper, left calf. States it has been there for a few days.  1145- Spoke to poison control, they will close her out.  1400- Patient scheduled for discharge to inpatient psy unit today.  Admission Date 07/05/2014   Admission Diagnosis Suicide attempt by multiple drug overdose (HCC)   Consults IP CONSULT TO PSYCHIATRY        Consults   PT   OT   Speech   Case Management       Palliative      Cardiac Monitoring Order   Yes   No     IV drips   Yes    Drip:                            Dose:  Drip:                            Dose:  Drip:                            Dose:   No     GI Prophylaxis   Yes   No         DVT Prophylaxis   SCDs:  Sequential Compression Device: Bilateral          Ted stockings:          Medication   Contraindicated   None      Activity Level Activity Level: Up with Assistance     Activity Assistance: Partial (one person)   Purposeful Rounding every 1-2 hour?   Yes   Schmidt Score  Total Score: 4   Bed Alarm (If score 3 or >)   Yes    Refused (See signed refusal form in chart)   Braden Score  Braden Score: 23   Braden Score (if score 14 or less)   PMT consult   Wound Care consult      Specialty bed    Nutrition consult          Needs prior to discharge:   Home O2 required:    Yes   No    If yes, how much O2 required?    Other:    Last Bowel Movement:        Influenza Vaccine          Pneumonia Vaccine           Diet Active Orders   Diet    DIET REGULAR      LDAs               Peripheral IV 07/05/14 Left Antecubital (Active)    Site Assessment Clean, dry, & intact 07/05/2014  7:55 PM   Phlebitis Assessment 0 07/05/2014  7:55 PM   Infiltration Assessment 0 07/05/2014  7:55 PM   Dressing Status Clean, dry, & intact;Intact 07/05/2014  7:55 PM   Dressing Type Tape;Transparent 07/05/2014  7:55 PM  Hub Color/Line Status Pink;Capped 07/05/2014  7:55 PM       Peripheral IV 07/05/14 Left Hand (Active)   Site Assessment Clean, dry, & intact 07/05/2014  7:55 PM   Phlebitis Assessment 0 07/05/2014  7:55 PM   Infiltration Assessment 0 07/05/2014  7:55 PM   Dressing Status Clean, dry, & intact 07/05/2014  7:55 PM   Dressing Type Tape;Transparent 07/05/2014  7:55 PM   Hub Color/Line Status Pink;Infusing 07/05/2014  7:55 PM                      Urinary Catheter      Intake & Output   Date 07/05/14 0700 - 07/06/14 0659 07/06/14 0700 - 07/07/14 0659   Shift 0700-1859 1900-0659 24 Hour Total 0700-1859 1900-0659 24 Hour Total   I  N  T  A  K  E   I.V.  (mL/kg/hr) 558.7  (0.4) 454.8  (0.4) 1013.5  (0.4)         Volume (acetylcysteine (ACETADOTE) 10,000 mg in dextrose 5% 1,000 mL infusion) 558.7 454.8 1013.5       Shift Total  (mL/kg) 558.7  (5.3) 454.8  (4.3) 1013.5  (9.6)      O  U  T  P  U  T   Urine  (mL/kg/hr) 300  (0.2)  300  (0.1)         Urine Voided 300  300         Urine Occurrence(s) 1 x  1 x       Shift Total  (mL/kg) 300  (2.8)  300  (2.8)      NET 258.7 454.8 713.5      Weight (kg) 105.3 105.3 105.3 105.3 105.3 105.3         Readmission Risk Assessment Tool Score Low Risk            8       Total Score        4 More than 1 Admission in calendar year    4 Patient Insurance is Medicare, Medicaid or Self Pay        Criteria that do not apply:    Relationship with PCP    Patient Living Status    Patient Length of Stay > 5    Charlson Comorbidity Score       Expected Length of Stay 2d 4h   Actual Length of Stay 1

## 2014-07-07 ENCOUNTER — Inpatient Hospital Stay
Admit: 2014-07-07 | Discharge: 2014-07-11 | Disposition: A | Discharge status: Home / Self Care | Payer: Self-pay | Source: Other Acute Inpatient Hospital | Attending: Psychiatry | Admitting: Psychiatry

## 2014-07-07 LAB — LIPID PANEL
CHOL/HDL Ratio: 3.2 (ref 0–5.0)
Cholesterol, total: 102 MG/DL (ref <200)
HDL Cholesterol: 32 MG/DL
LDL, calculated: 47.6 MG/DL (ref 0–100)
Triglyceride: 112 MG/DL (ref <150)
VLDL, calculated: 22.4 MG/DL

## 2014-07-07 LAB — METABOLIC PANEL, BASIC
Anion gap: 8 mmol/L (ref 5–15)
BUN/Creatinine ratio: 23 — ABNORMAL HIGH (ref 12–20)
BUN: 15 MG/DL (ref 6–20)
CO2: 24 mmol/L (ref 21–32)
Calcium: 7.6 MG/DL — ABNORMAL LOW (ref 8.5–10.1)
Chloride: 109 mmol/L — ABNORMAL HIGH (ref 97–108)
Creatinine: 0.64 MG/DL (ref 0.55–1.02)
GFR est AA: >60 mL/min/{1.73_m2} (ref >60)
GFR est non-AA: >60 mL/min/{1.73_m2} (ref >60)
Glucose: 98 mg/dL (ref 65–100)
Potassium: 3.8 mmol/L (ref 3.5–5.1)
Sodium: 141 mmol/L (ref 136–145)

## 2014-07-07 LAB — MAGNESIUM: Magnesium: 1.7 mg/dL (ref 1.6–2.4)

## 2014-07-07 LAB — GLUCOSE, FASTING: Glucose: 100 MG/DL (ref 65–100)

## 2014-07-07 MED ORDER — ZOLPIDEM 5 MG TAB
5 mg | Freq: Every evening | ORAL | Status: DC | PRN
Start: 2014-07-07 — End: 2014-07-11
  Administered 2014-07-07 – 2014-07-11 (×4): via ORAL

## 2014-07-07 MED ORDER — MAGNESIUM HYDROXIDE 400 MG/5 ML ORAL SUSP
400 mg/5 mL | Freq: Every day | ORAL | Status: DC | PRN
Start: 2014-07-07 — End: 2014-07-11

## 2014-07-07 MED ORDER — ZOLPIDEM 10 MG TAB
10 mg | Freq: Every evening | ORAL | Status: DC | PRN
Start: 2014-07-07 — End: 2014-07-06

## 2014-07-07 MED ORDER — SUCRALFATE 100 MG/ML ORAL SUSP
100 mg/mL | Freq: Three times a day (TID) | ORAL | Status: DC | PRN
Start: 2014-07-07 — End: 2014-07-11

## 2014-07-07 MED ORDER — BENZTROPINE 1 MG/ML IJ SOLN
1 mg/mL | Freq: Two times a day (BID) | INTRAMUSCULAR | Status: DC | PRN
Start: 2014-07-07 — End: 2014-07-11

## 2014-07-07 MED ORDER — NICOTINE 21 MG/24 HR DAILY PATCH
21 mg/24 hr | Freq: Every day | TRANSDERMAL | Status: DC | PRN
Start: 2014-07-07 — End: 2014-07-07

## 2014-07-07 MED ORDER — IBUPROFEN 400 MG TAB
400 mg | Freq: Three times a day (TID) | ORAL | Status: DC | PRN
Start: 2014-07-07 — End: 2014-07-07

## 2014-07-07 MED ORDER — BENZTROPINE 2 MG TAB
2 mg | Freq: Two times a day (BID) | ORAL | Status: DC | PRN
Start: 2014-07-07 — End: 2014-07-11

## 2014-07-07 MED ORDER — LORAZEPAM 2 MG/ML IJ SOLN
2 mg/mL | INTRAMUSCULAR | Status: DC | PRN
Start: 2014-07-07 — End: 2014-07-11

## 2014-07-07 MED ORDER — LITHIUM CARBONATE SR 450 MG TAB
450 mg | Freq: Every day | ORAL | Status: DC
Start: 2014-07-07 — End: 2014-07-09
  Administered 2014-07-07 – 2014-07-09 (×3): via ORAL

## 2014-07-07 MED ORDER — OLANZAPINE 5 MG TAB
5 mg | Freq: Every evening | ORAL | Status: DC
Start: 2014-07-07 — End: 2014-07-11
  Administered 2014-07-08 – 2014-07-11 (×4): via ORAL

## 2014-07-07 MED ORDER — NICOTINE (POLACRILEX) 4 MG GUM
4 mg | BUCCAL | Status: DC | PRN
Start: 2014-07-07 — End: 2014-07-11
  Administered 2014-07-07 – 2014-07-10 (×4): via ORAL

## 2014-07-07 MED ORDER — ACETAMINOPHEN 325 MG TABLET
325 mg | ORAL | Status: DC | PRN
Start: 2014-07-07 — End: 2014-07-11
  Administered 2014-07-09 – 2014-07-10 (×2): via ORAL

## 2014-07-07 MED ORDER — OLANZAPINE 5 MG TAB
5 mg | Freq: Four times a day (QID) | ORAL | Status: DC | PRN
Start: 2014-07-07 — End: 2014-07-11
  Administered 2014-07-07: 20:00:00 via ORAL

## 2014-07-07 MED ORDER — LORAZEPAM 1 MG TAB
1 mg | ORAL | Status: DC | PRN
Start: 2014-07-07 — End: 2014-07-11
  Administered 2014-07-07 – 2014-07-11 (×3): via ORAL

## 2014-07-07 MED ORDER — PANTOPRAZOLE 40 MG TAB, DELAYED RELEASE
40 mg | Freq: Every day | ORAL | Status: DC
Start: 2014-07-07 — End: 2014-07-11
  Administered 2014-07-07 – 2014-07-11 (×5): via ORAL

## 2014-07-07 MED ORDER — ZIPRASIDONE MESYLATE 20 MG IM SOLR
20 mg/mL (final conc.) | Freq: Two times a day (BID) | INTRAMUSCULAR | Status: DC | PRN
Start: 2014-07-07 — End: 2014-07-11

## 2014-07-07 MED FILL — SUCRALFATE 100 MG/ML ORAL SUSP: 100 mg/mL | ORAL | Qty: 10

## 2014-07-07 MED FILL — OLANZAPINE 5 MG TAB: 5 mg | ORAL | Qty: 1

## 2014-07-07 MED FILL — PANTOPRAZOLE 40 MG TAB, DELAYED RELEASE: 40 mg | ORAL | Qty: 1

## 2014-07-07 MED FILL — NICORELIEF 4 MG GUM: 4 mg | BUCCAL | Qty: 50

## 2014-07-07 MED FILL — LORAZEPAM 1 MG TAB: 1 mg | ORAL | Qty: 1

## 2014-07-07 MED FILL — LITHIUM CARBONATE SR 450 MG TAB: 450 mg | ORAL | Qty: 2

## 2014-07-07 MED FILL — ZOLPIDEM 5 MG TAB: 5 mg | ORAL | Qty: 1

## 2014-07-07 NOTE — Behavioral Health Treatment Team (Signed)
Patient is visiting with her co-worker/friend and boss.

## 2014-07-07 NOTE — Behavioral Health Treatment Team (Signed)
Pt. Seen and evaluated by hospitalist for admission H&P. Pt. Slept 6+ hours. NAD. 15 min. Checks maintained.

## 2014-07-07 NOTE — Behavioral Health Treatment Team (Signed)
TRANSFER - OUT REPORT:    Verbal report given to N.C, RN(name) on Sabrina Saunders  being transferred to Psychiatry ICU(unit) for routine progression of care       Report consisted of patient???s Situation, Background, Assessment and   Recommendations(SBAR).     Information from the following report(s) SBAR was reviewed with the receiving nurse.    Opportunity for questions and clarification was provided.      Patient transported with:   Registered Nurse

## 2014-07-07 NOTE — H&P (Signed)
INITIAL PSYCHIATRIC EVALUATION         IDENTIFICATION:    Patient Name  Sabrina Saunders   Date of Birth 22-Aug-1980   CSN [id number]   Medical Record Number  [id number]      Age  34 y.o.   PCP None   Admit date:  07/06/2014    Room Number  729/02  @ [facility] hospital   Date of Service  07/07/2014            HISTORY         REASON FOR HOSPITALIZATION:  CC: "OD/SI". Pt admitted under a voluntary basis for severe depression with suicidal ideations proving to be an imminent danger to self and an inability to care for self.      HISTORY OF PRESENT ILLNESS:    The patient, Sabrina Saunders, is a 34 y.o.  WHITE OR CAUCASIAN female with a past psychiatric history significant for bipolar disorder who presents at this time for an exacerbation of the principle diagnosis of Bipolar 1 disorder ([place]). Patient reports/evidences the following emotional symptoms:  agitation.  The above symptoms have been present for 3 days. These symptoms are of severe severity. The symptoms are constant in nature.  Additional symptomatology include anxiety . The patient's condition has been precipitated by medication non compliance and psychosocial stressors (job loss ).  Patient's condition made worse by treatment noncompliance. UDSnegative, BAL=0.     PROBLEM LIST:  Patient Active Problem List   Diagnosis Code   ??? Suicide attempt by multiple drug overdose ([place]) T50.902A   ??? Bipolar disorder with depression ([place]) F31.30   ??? Insomnia G47.00   ??? Cannabis abuse F12.10   ??? Tobacco use disorder, mild, abuse Z72.0   ??? Obesity (BMI 30-39.9) E66.9   ??? Bipolar 1 disorder (HCC) F31.9   ??? Hypokalemia E87.6        ALLERGIES:  Allergies   Allergen Reactions   ??? Depakote [Divalproex] Other (comments)      MEDICATIONS PRIOR TO ADMISSION:  Prescriptions prior to admission   Medication Sig   ??? pantoprazole (PROTONIX) 40 mg tablet Take 40 mg by mouth daily. Indications: HEARTBURN   ??? nicotine (NICODERM CQ) 7 mg/24 hr 1 Patch by TransDERmal route daily as  needed (Tobacco crave) for up to 30 days.   ??? sucralfate (CARAFATE) 100 mg/mL suspension Take 10 mL by mouth three (3) times daily as needed (indigestion).      PAST MEDICAL HISTORY:  Past Medical History   Diagnosis Date   ??? Ectopic pregnancy    ??? Depression    ??? Sleep disorder    ??? Substance abuse    ??? Suicidal thoughts    ??? Ill-defined condition    History reviewed. No pertinent past surgical history.   SOCIAL HISTORY:    History     Social History   ??? Marital Status: SINGLE     Spouse Name: N/A   ??? Number of Children: N/A   ??? Years of Education: N/A     Occupational History   ??? Not on file.     Social History Main Topics   ??? Smoking status: Current Every Day Smoker   ??? Smokeless tobacco: Not on file   ??? Alcohol Use: Yes   ??? Drug Use: Yes     Special: OTC, Marijuana   ??? Sexual Activity: Not on file     Other Topics Concern   ??? Not on file     Social History  Narrative    34 year old single caucasian female voluntarily admitted from [facility] (where she had a medical admission following an intentional overdose of Tylenol and Exedrin). Pt. Reportedly lost her job in [employer] to [employer] yesterday. After this happened she took the overdose and called a friend to bring hrt to the ED.  Pt. Regretted the OD. She has 3 sons ages 48,11,15, of whom she does not have custody. Pt. Chronically resides in hotels.      FAMILY HISTORY:    History reviewed. No pertinent family history.    REVIEW OF SYSTEMS:   Psychological ROS: positive for - suicidal ideation  Respiratory ROS: no cough, shortness of breath, or wheezing  Cardiovascular ROS: no chest pain or dyspnea on exertion  Pertinent items are noted in the History of Present Illness. All other Systems reviewed and are considered negative.           MENTAL STATUS EXAM & VITALS       MENTAL STATUS EXAM (MSE):    MSE FINDINGS ARE WITHIN NORMAL LIMITS (WNL) UNLESS OTHERWISE STATED BELOW.    Orientation oriented to time, place and person    Vital Signs (BP,Pulse, Temp) see below (reviewed 07/07/2014); vital signs are WNL if not listed below.   Gait and Station stable/steady, no ataxia   Abnormal Muscular Movements, Tone, and Behavior no EPS, no Tardive Dyskinesia, no abnormal muscular movements; wnl tone   Relations cooperative   General Appearance:  age appropriate   Language no aphasia or dysarthria   Speech:  normal pitch and normal volume   Thought Processes logical, wnl rate of thoughts, good abstract reasoning and computation   Thought Associations within normal limits   Thought Content not internally preoccupied    Suicidal Ideations none   Homicidal Ideations none   Mood:  irritable   Affect:  mood-congruent   Memory recent  adequate   Memory remote:  adequate   Concentration/Attention:  adequate   Fund of Knowledge average   Insight:  limited   Reliability fair   Judgment:  limited            VITALS:     Patient Vitals for the past 24 hrs:   Temp Pulse Resp BP SpO2   07/07/14 0830 98.2 ??F (36.8 ??C) 67 16 115/71 mmHg 99 %   07/06/14 2100 97.6 ??F (36.4 ??C) 89 16 117/81 mmHg 96 %              DATA       LABORATORY DATA:  Labs Reviewed   METABOLIC PANEL, BASIC - Abnormal; Notable for the following:     Chloride 109 (*)     BUN/Creatinine ratio 23 (*)     Calcium 7.6 (*)     All other components within normal limits   GLUCOSE, FASTING   LIPID PANEL   MAGNESIUM     Admission on 07/06/2014   Component Date Value Ref Range Status   ??? Glucose 07/07/2014 100  65 - 100 MG/DL Final   ??? LIPID PROFILE 07/07/2014       Final   ??? Cholesterol, total 07/07/2014 102  <200 MG/DL Final   ??? Triglyceride 07/07/2014 112  <150 MG/DL Final   ??? HDL Cholesterol 07/07/2014 32   Final   ??? LDL, calculated 07/07/2014 47.6  0 - 100 MG/DL Final   ??? VLDL, calculated 07/07/2014 22.4   Final   ??? CHOL/HDL Ratio 07/07/2014 3.2  0 - 5.0  Final   ??? Sodium 07/07/2014 141  136 - 145 mmol/L Final   ??? Potassium 07/07/2014 3.8  3.5 - 5.1 mmol/L Final    ??? Chloride 07/07/2014 109* 97 - 108 mmol/L Final   ??? CO2 07/07/2014 24  21 - 32 mmol/L Final   ??? Anion gap 07/07/2014 8  5 - 15 mmol/L Final   ??? Glucose 07/07/2014 98  65 - 100 mg/dL Final   ??? BUN 07/07/2014 15  6 - 20 MG/DL Final   ??? Creatinine 07/07/2014 0.64  0.55 - 1.02 MG/DL Final   ??? BUN/Creatinine ratio 07/07/2014 23* 12 - 20   Final   ??? GFR est AA 07/07/2014 >60  >60 ml/min/1.91m Final   ??? GFR est non-AA 07/07/2014 >60  >60 ml/min/1.776mFinal   ??? Calcium 07/07/2014 7.6* 8.5 - 10.1 MG/DL Final   ??? Magnesium 07/07/2014 1.7  1.6 - 2.4 mg/dL Final   Admission on 07/05/2014, Discharged on 07/06/2014   Component Date Value Ref Range Status   ??? Ventricular Rate 07/05/2014 87   Corrected   ??? Atrial Rate 07/05/2014 87   Corrected   ??? P-R Interval 07/05/2014 142   Corrected   ??? QRS Duration 07/05/2014 78   Corrected   ??? Q-T Interval 07/05/2014 394   Corrected   ??? QTC Calculation ([place]) 07/05/2014 474   Corrected   ??? Calculated P Axis 07/05/2014 71   Corrected   ??? Calculated R Axis 07/05/2014 54   Corrected   ??? Calculated T Axis 07/05/2014 48   Corrected   ??? Diagnosis 07/05/2014    Corrected                    Value:Normal sinus rhythm  Right atrial enlargement  No previous ECGs available  Confirmed by BaBonney [id number] 07/05/2014 7:43:52 AM  Also confirmed by HaComer [id number] on 07/05/2014 7:46:30 AM     ??? WBC 07/05/2014 9.5  3.6 - 11.0 K/uL Final   ??? RBC 07/05/2014 4.73  3.80 - 5.20 M/uL Final   ??? HGB 07/05/2014 12.2  11.5 - 16.0 g/dL Final   ??? HCT 07/05/2014 37.9  35.0 - 47.0 % Final   ??? MCV 07/05/2014 80.1  80.0 - 99.0 FL Final   ??? MCH 07/05/2014 25.8* 26.0 - 34.0 PG Final   ??? MCHC 07/05/2014 32.2  30.0 - 36.5 g/dL Final   ??? RDW 07/05/2014 13.7  11.5 - 14.5 % Final   ??? PLATELET 07/05/2014 179  150 - 400 K/uL Final   ??? NEUTROPHILS 07/05/2014 73  32 - 75 % Final   ??? LYMPHOCYTES 07/05/2014 21  12 - 49 % Final   ??? MONOCYTES 07/05/2014 5  5 - 13 % Final   ??? EOSINOPHILS 07/05/2014 1  0 - 7 % Final    ??? BASOPHILS 07/05/2014 0  0 - 1 % Final   ??? ABS. NEUTROPHILS 07/05/2014 7.0  1.8 - 8.0 K/UL Final   ??? ABS. LYMPHOCYTES 07/05/2014 2.0  0.8 - 3.5 K/UL Final   ??? ABS. MONOCYTES 07/05/2014 0.5  0.0 - 1.0 K/UL Final   ??? ABS. EOSINOPHILS 07/05/2014 0.1  0.0 - 0.4 K/UL Final   ??? ABS. BASOPHILS 07/05/2014 0.0  0.0 - 0.1 K/UL Final   ??? CK 07/05/2014 77  26 - 192 U/L Final   ??? CK - MB 07/05/2014 <0.5* 0.5 - 3.6 NG/ML Final   ??? CK-MB Index 07/05/2014 CANNOT BE CALCULATED  0 - 2.5   Final   ??? Sodium 07/05/2014 138  136 - 145 mmol/L Final   ??? Potassium 07/05/2014 3.4* 3.5 - 5.1 mmol/L Final   ??? Chloride 07/05/2014 105  97 - 108 mmol/L Final   ??? CO2 07/05/2014 22  21 - 32 mmol/L Final   ??? Anion gap 07/05/2014 11  5 - 15 mmol/L Final   ??? Glucose 07/05/2014 186* 65 - 100 mg/dL Final   ??? BUN 07/05/2014 12  6 - 20 MG/DL Final   ??? Creatinine 07/05/2014 0.78  0.55 - 1.02 MG/DL Final   ??? BUN/Creatinine ratio 07/05/2014 15  12 - 20   Final   ??? GFR est AA 07/05/2014 >60  >60 ml/min/1.45m Final   ??? GFR est non-AA 07/05/2014 >60  >60 ml/min/1.773mFinal   ??? Calcium 07/05/2014 8.4* 8.5 - 10.1 MG/DL Final   ??? Bilirubin, total 07/05/2014 0.4  0.2 - 1.0 MG/DL Final   ??? ALT 07/05/2014 19  12 - 78 U/L Final   ??? AST 07/05/2014 12* 15 - 37 U/L Final   ??? Alk. phosphatase 07/05/2014 97  45 - 117 U/L Final   ??? Protein, total 07/05/2014 7.8  6.4 - 8.2 g/dL Final   ??? Albumin 07/05/2014 3.7  3.5 - 5.0 g/dL Final   ??? Globulin 07/05/2014 4.1* 2.0 - 4.0 g/dL Final   ??? A-G Ratio 07/05/2014 0.9* 1.1 - 2.2   Final   ??? Magnesium 07/05/2014 1.5* 1.6 - 2.4 mg/dL Final   ??? INR 07/05/2014 1.1  0.9 - 1.1   Final   ??? Prothrombin time 07/05/2014 10.7  9.0 - 11.1 sec Final   ??? Troponin-I, Qt. 07/05/2014 <0.04  <0.05 ng/mL Final   ??? Color 07/05/2014 YELLOW/STRAW   Final   ??? Appearance 07/05/2014 CLEAR  CLEAR   Final   ??? Specific gravity 07/05/2014 >1.030* 1.003 - 1.030 Final   ??? pH (UA) 07/05/2014 5.0  5.0 - 8.0   Final   ??? Protein 07/05/2014 TRACE* NEG mg/dL Final    ??? Glucose 07/05/2014 100* NEG mg/dL Final   ??? Ketone 07/05/2014 15* NEG mg/dL Final   ??? Bilirubin 07/05/2014 NEGATIVE   NEG   Final   ??? Blood 07/05/2014 NEGATIVE   NEG   Final   ??? Urobilinogen 07/05/2014 0.2  0.2 - 1.0 EU/dL Final   ??? Nitrites 07/05/2014 NEGATIVE   NEG   Final   ??? Leukocyte Esterase 07/05/2014 NEGATIVE   NEG   Final   ??? WBC 07/05/2014 0-4  0 - 4 /hpf Final   ??? RBC 07/05/2014 0-5  0 - 5 /hpf Final   ??? Epithelial cells 07/05/2014 FEW  FEW /lpf Final   ??? Bacteria 07/05/2014 NEGATIVE   NEG /hpf Final   ??? Mucus 07/05/2014 TRACE* NEG /lpf Final   ??? ACETAMINOPHEN 07/05/2014 227* 10 - 30 ug/mL Final   ??? ALCOHOL(ETHYL),SERUM 07/05/2014 <10  <10 MG/DL Final   ??? SALICYLATE 0400/86/7619.8* 2.8 - 20.0 MG/DL Final   ??? AMPHETAMINE 07/05/2014 NEGATIVE   NEG   Final   ??? BARBITURATES 07/05/2014 NEGATIVE   NEG   Final ??? BENZODIAZEPINE 07/05/2014 NEGATIVE   NEG   Final   ??? COCAINE 07/05/2014 NEGATIVE   NEG   Final   ??? METHADONE 07/05/2014 NEGATIVE   NEG   Final   ??? OPIATES 07/05/2014 NEGATIVE   NEG   Final   ??? PCP(PHENCYCLIDINE) 07/05/2014 NEGATIVE   NEG   Final   ???  THC (TH-CANNABINOL) 07/05/2014 POSITIVE* NEG   Final   ??? Drug screen comment 07/05/2014 (NOTE)   Final   ??? Pregnancy test,urine (POC) 07/05/2014 NEGATIVE   NEG   Final   ??? TSH 07/05/2014 0.79  0.36 - 3.74 uIU/mL Final   ??? Sed rate, automated 07/05/2014 29* 0 - 20 mm/hr Final   ??? ACETAMINOPHEN 07/05/2014 54* 10 - 30 ug/mL Final   ??? ACETAMINOPHEN 07/06/2014 <2* 10 - 30 ug/mL Final   ??? Sodium 07/06/2014 142  136 - 145 mmol/L Final   ??? Potassium 07/06/2014 3.1* 3.5 - 5.1 mmol/L Final   ??? Chloride 07/06/2014 109* 97 - 108 mmol/L Final   ??? CO2 07/06/2014 24  21 - 32 mmol/L Final   ??? Anion gap 07/06/2014 9  5 - 15 mmol/L Final   ??? Glucose 07/06/2014 148* 65 - 100 mg/dL Final   ??? BUN 07/06/2014 6  6 - 20 MG/DL Final   ??? Creatinine 07/06/2014 0.57  0.55 - 1.02 MG/DL Final   ??? BUN/Creatinine ratio 07/06/2014 11* 12 - 20   Final    ??? GFR est AA 07/06/2014 >60  >60 ml/min/1.40m Final   ??? GFR est non-AA 07/06/2014 >60  >60 ml/min/1.727mFinal   ??? Calcium 07/06/2014 7.9* 8.5 - 10.1 MG/DL Final   ??? WBC 07/06/2014 6.0  3.6 - 11.0 K/uL Final   ??? RBC 07/06/2014 4.09  3.80 - 5.20 M/uL Final   ??? HGB 07/06/2014 10.6* 11.5 - 16.0 g/dL Final   ??? HCT 07/06/2014 32.7* 35.0 - 47.0 % Final   ??? MCV 07/06/2014 80.0  80.0 - 99.0 FL Final   ??? MCH 07/06/2014 25.9* 26.0 - 34.0 PG Final   ??? MCHC 07/06/2014 32.4  30.0 - 36.5 g/dL Final   ??? RDW 07/06/2014 13.8  11.5 - 14.5 % Final   ??? PLATELET 07/06/2014 145* 150 - 400 K/uL Final   ??? NEUTROPHILS 07/06/2014 48  32 - 75 % Final   ??? LYMPHOCYTES 07/06/2014 44  12 - 49 % Final   ??? MONOCYTES 07/06/2014 5  5 - 13 % Final   ??? EOSINOPHILS 07/06/2014 2  0 - 7 % Final   ??? BASOPHILS 07/06/2014 1  0 - 1 % Final   ??? ABS. NEUTROPHILS 07/06/2014 2.9  1.8 - 8.0 K/UL Final   ??? ABS. LYMPHOCYTES 07/06/2014 2.6  0.8 - 3.5 K/UL Final   ??? ABS. MONOCYTES 07/06/2014 0.3  0.0 - 1.0 K/UL Final   ??? ABS. EOSINOPHILS 07/06/2014 0.1  0.0 - 0.4 K/UL Final   ??? ABS. BASOPHILS 07/06/2014 0.0  0.0 - 0.1 K/UL Final   ??? ACETAMINOPHEN 07/06/2014 2* 10 - 30 ug/mL Final   ??? Protein, total 07/06/2014 6.6  6.4 - 8.2 g/dL Final   ??? Albumin 07/06/2014 2.9* 3.5 - 5.0 g/dL Final   ??? Globulin 07/06/2014 3.7  2.0 - 4.0 g/dL Final   ??? A-G Ratio 07/06/2014 0.8* 1.1 - 2.2   Final   ??? Bilirubin, total 07/06/2014 0.2  0.2 - 1.0 MG/DL Final   ??? Bilirubin, direct 07/06/2014 <0.1  0.0 - 0.2 MG/DL Final   ??? Alk. phosphatase 07/06/2014 72  45 - 117 U/L Final   ??? AST 07/06/2014 9* 15 - 37 U/L Final   ??? ALT 07/06/2014 21  12 - 78 U/L Final        RADIOLOGY REPORTS:  No results found for this or any previous visit.No results found.  MEDICATIONS       ALL MEDICATIONS  Current Facility-Administered Medications   Medication Dose Route Frequency   ??? lithium carbonate CR (ESKALITH CR) tablet 900 mg  900 mg Oral DAILY   ??? OLANZapine (ZyPREXA) tablet 10 mg  10 mg Oral QHS    ??? ziprasidone (GEODON) 20 mg in sterile water (preservative free) injection  20 mg IntraMUSCular BID PRN   ??? OLANZapine (ZyPREXA) tablet 5 mg  5 mg Oral Q6H PRN   ??? benztropine (COGENTIN) tablet 2 mg  2 mg Oral BID PRN   ??? benztropine (COGENTIN) injection 2 mg  2 mg IntraMUSCular Q12H PRN   ??? LORazepam (ATIVAN) injection 2 mg  2 mg IntraMUSCular Q4H PRN   ??? LORazepam (ATIVAN) tablet 1 mg  1 mg Oral Q4H PRN   ??? acetaminophen (TYLENOL) tablet 650 mg  650 mg Oral Q4H PRN   ??? magnesium hydroxide (MILK OF MAGNESIA) oral suspension 30 mL  30 mL Oral DAILY PRN   ??? nicotine (NICODERM CQ) 21 mg/24 hr patch 1 Patch  1 Patch TransDERmal DAILY PRN   ??? pantoprazole (PROTONIX) tablet 40 mg  40 mg Oral ACB   ??? sucralfate (CARAFATE) 100 mg/mL oral suspension 1 g  1,000 mg Oral TID PRN   ??? zolpidem (AMBIEN) tablet 5 mg  5 mg Oral QHS PRN      SCHEDULED MEDICATIONS  Current Facility-Administered Medications   Medication Dose Route Frequency   ??? lithium carbonate CR (ESKALITH CR) tablet 900 mg  900 mg Oral DAILY   ??? OLANZapine (ZyPREXA) tablet 10 mg  10 mg Oral QHS   ??? pantoprazole (PROTONIX) tablet 40 mg  40 mg Oral ACB                ASSESSMENT & PLAN        The patient Maryon Kemnitz is a 34 y.o.  female who presents at this time for treatment of the following diagnoses:  Patient Active Hospital Problem List:   Bipolar 1 disorder ([place]) (07/06/2014)    Assessment: mania alternating with depression    Plan: start lithum and zyprexa     Bipolar 1 disorder ([place])      In summary, Kessler Kopinski presents with a severe exacerbation of the principal diagnosis, Bipolar 1 disorder ([place]).  While on the unit Camauri Fleece will be provided with individual, milieu, occupational, group, and substance abuse therapies to address target symptoms as deemed appropriate for the individual patient.           I agree with decision to admit patient. I have spoken to [facility] psychiatric assessor/ED staff regarding the nature of patients's admission at this time.     A coordinated, multidisplinary treatment team round was conducted with the patient; that includes the nurse, unit pharmcist, Catering manager all present.     The following regarding medications was addressed during rounds with patient:   the risks and benefits of the proposed medication. The patient was given the opportunity to ask questions. Informed consent given to the use of the above medications.     I will continue to adjust psychiatric and non-psychiatric medications (see above "medication" section and orders section for details) as deemed appropriate & based upon diagnoses and response to treatment.     I have reviewed admission (and previous/old) labs and medical tests in the EHR and or transferring hospital documents. I will continue to order blood tests/labs and diagnostic tests as deemed appropriate and review  results as they become available (see orders for details).    I have reviewed old psychiatric and medical records available in the EHR. I Will order additional psychiatric records from other institutions to further elucidate the nature of patient's psychopathology and review once available.    I will gather additional collateral information from friends, family and o/p treatment team to further elucidate the nature of patient's psychopathology and baselline level of psychiatric functioning.        ESTIMATED LENGTH OF STAY:   3-5 days       STRENGTHS:  Access to housing/residential stability, Knowledge of medications and Financial stability                      SIGNED:    Isac Caddy, MD  07/07/2014
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
## 2014-07-07 NOTE — Consults (Signed)
Medical Consultation Report    Patient:  Sabrina Saunders  MRN:  [id number]  Date of birth:  1980-12-16  Age:  34 y.o.     Primary care provider:  None    Date of admission:  07/06/2014    Date of consultation:  07/07/2014    Requesting physician:  Gerlene Burdock, MD    Reason for consultation:  Medical Clearance                                History of present illness  Sabrina Saunders is a 35 y.o. female who presents with as a transfer from [facility] following an intentional overdose of over the counter medications in a suicide attempt. The patient was seen and treated at [facility] since July 05, 2014. She reportedly ingested some combination of approximately 70 tabs of Excedrin, Benadryl, and Tylenol. She filmed the episode and sent it to a friend. She was treated with the Acetadote. Her LFTs never elevated. She has no complaints other than a bruise on the back of her left leg above the knee.     Past Medical History   Diagnosis Date   ??? Ectopic pregnancy  ??? Depression    ??? Sleep disorder    ??? Substance abuse    ??? Suicidal thoughts    ??? Ill-defined condition       History reviewed. No pertinent past surgical history.     Prior to Admission medications    Medication Sig Start Date End Date Taking? Authorizing Provider   bisacodyl (DULCOLAX) 5 mg EC tablet Take 1 Tab by mouth now for 1 dose. 07/06/14 07/06/14  Mahala Menghini, DO   nicotine (NICODERM CQ) 7 mg/24 hr 1 Patch by TransDERmal route daily as needed (Tobacco crave) for up to 30 days. 07/06/14 08/05/14  Mahala Menghini, DO   pantoprazole (PROTONIX) 40 mg tablet Take 1 Tab by mouth Daily (before breakfast). 07/06/14   Mahala Menghini, DO   potassium chloride SR (KLOR-CON 10) 10 mEq tablet Take 3 Tabs by mouth now for 1 dose. 07/06/14 07/06/14  Mahala Menghini, DO   sucralfate (CARAFATE) 100 mg/mL suspension Take 10 mL by mouth three (3)  times daily as needed (indigestion). 07/06/14   Mahala Menghini, DO     Current Facility-Administered Medications   Medication Dose Route Frequency   ??? ziprasidone (GEODON) 20 mg in sterile water (preservative free) injection  20 mg IntraMUSCular BID PRN   ??? OLANZapine (ZyPREXA) tablet 5 mg  5 mg Oral Q6H PRN   ??? benztropine (COGENTIN) tablet 2 mg  2 mg Oral BID PRN   ??? benztropine (COGENTIN) injection 2 mg  2 mg IntraMUSCular Q12H PRN   ??? LORazepam (ATIVAN) injection 2 mg  2 mg IntraMUSCular Q4H PRN   ??? LORazepam (ATIVAN) tablet 1 mg  1 mg Oral Q4H PRN   ??? acetaminophen (TYLENOL) tablet 650 mg  650 mg Oral Q4H PRN   ??? ibuprofen (MOTRIN) tablet 400 mg  400 mg Oral Q8H PRN   ??? magnesium hydroxide (MILK OF MAGNESIA) oral suspension 30 mL  30 mL Oral DAILY PRN   ??? nicotine (NICODERM CQ) 21 mg/24 hr patch 1 Patch  1 Patch TransDERmal DAILY PRN   ??? pantoprazole (PROTONIX) tablet 40 mg  40 mg Oral ACB   ??? sucralfate (CARAFATE) 100 mg/mL oral suspension 1 g  1,000 mg Oral TID PRN   ???  zolpidem (AMBIEN) tablet 5 mg  5 mg Oral QHS PRN     Allergies   Allergen Reactions   ??? Depakote [Divalproex] Other (comments)      History reviewed. No pertinent family history.     Social history  Living situation  x  Independent     With family care      Assisted living      SNF    Ambulates  x  Independently      With cane       Assisted walker         Alcohol history    None   x  Social     Chronic   Smoking history    None     Former smoker   x  Current smoker     History   Smoking status   ??? Current Every Day Smoker   Smokeless tobacco   ??? Not on file     Illicit drug use    None     Prior use    x  Current use      Code status  x  Full code     DNR/DNI          Review of systems  The patient denies any fever, chills, vision changes, difficulty swallowing, cough, congestion, chest pain, palpitations, rashes, bleeding, focal weakness, or dysuria.   Pertinent items are noted in the History of Present Illness.    The remainder of the review of systems was reviewed and is non-contributory.    Physical Examination   BP 117/81 mmHg   Pulse 89   Temp(Src) 97.6 ??F (36.4 ??C)   Resp 16   SpO2 96%   LMP 07/06/2014   Breastfeeding? No             General:  Alert, cooperative, no distress   Head:  Normocephalic, without obvious abnormality, atraumatic   Eyes:  Conjunctivae/corneas clear. PERRL, EOMs intact   E/N/M/T: Nares normal. Septum midline. No nasal drainage or sinus tenderness  Lips, mucosa, and tongue normal   Teeth and gums normal  Clear oropharynx   Neck: Normal appearance and movements, symmetrical, trachea midline  No palpable adenopathy  No thyroid enlargement, tenderness or nodules  No carotid bruit   Normal JVP   Lungs:   Symmetrical chest expansion and respiratory effort  Clear to auscultation bilaterally   Chest wall:  No tenderness or deformity   Heart:  Regular rhythm   Sounds normal; no murmur, click, rub or gallop   Abdomen:   Soft, no tenderness, obese  Bowel sounds normal  No masses or hepatosplenomegaly  No hernias present   Back: No CVA tenderness   Extremities: Extremities normal, atraumatic, bruise on back of left leg above the knee  No cyanosis or edema  No DVT signs   Pulses 2+ and symmetric all extremities   Skin: No rashes or ulcers   Musculo-      skeletal: Gait not tested  Normal symmetry, ROM, strength and tone   Neuro: Normal cranial nerves  Normal reflexes and sensation   Psych: Alert, oriented x3  Normal affect, judgement and insight   Geniturinary: Deferred     Data Review    EKG: deferred    Imaging  Not indicated    24 Hour Results:  Recent Results (from the past 24 hour(s))   METABOLIC PANEL, BASIC    Collection Time: 07/06/14  5:32 AM   Result Value  Ref Range    Sodium 142 136 - 145 mmol/L    Potassium 3.1 (L) 3.5 - 5.1 mmol/L    Chloride 109 (H) 97 - 108 mmol/L    CO2 24 21 - 32 mmol/L    Anion gap 9 5 - 15 mmol/L    Glucose 148 (H) 65 - 100 mg/dL    BUN 6 6 - 20 MG/DL     Creatinine 1.61 0.96 - 1.02 MG/DL    BUN/Creatinine ratio 11 (L) 12 - 20      GFR est AA >60 >60 ml/min/1.25m2    GFR est non-AA >60 >60 ml/min/1.33m2    Calcium 7.9 (L) 8.5 - 10.1 MG/DL   CBC WITH AUTOMATED DIFF    Collection Time: 07/06/14  5:32 AM   Result Value Ref Range    WBC 6.0 3.6 - 11.0 K/uL    RBC 4.09 3.80 - 5.20 M/uL    HGB 10.6 (L) 11.5 - 16.0 g/dL    HCT 04.5 (L) 40.9 - 47.0 %    MCV 80.0 80.0 - 99.0 FL    MCH 25.9 (L) 26.0 - 34.0 PG    MCHC 32.4 30.0 - 36.5 g/dL    RDW 81.1 91.4 - 78.2 %    PLATELET 145 (L) 150 - 400 K/uL    NEUTROPHILS 48 32 - 75 %    LYMPHOCYTES 44 12 - 49 %    MONOCYTES 5 5 - 13 %    EOSINOPHILS 2 0 - 7 %    BASOPHILS 1 0 - 1 %    ABS. NEUTROPHILS 2.9 1.8 - 8.0 K/UL    ABS. LYMPHOCYTES 2.6 0.8 - 3.5 K/UL    ABS. MONOCYTES 0.3 0.0 - 1.0 K/UL    ABS. EOSINOPHILS 0.1 0.0 - 0.4 K/UL    ABS. BASOPHILS 0.0 0.0 - 0.1 K/UL   ACETAMINOPHEN    Collection Time: 07/06/14  5:32 AM   Result Value Ref Range    ACETAMINOPHEN 2 (L) 10 - 30 ug/mL     Recent Labs      07/06/14   0532  07/05/14   0245   WBC  6.0  9.5   HGB  10.6*  12.2   HCT  32.7*  37.9   PLT  145*  179     Recent Labs      07/06/14   0532  07/06/14   0001  07/05/14   0245   NA  142   --   138   K  3.1*   --   3.4*   CL  109*   --   105   CO2  24   --   22   GLU  148*   --   186*   BUN  6   --   12   CREA  0.57   --   0.78   CA  7.9*   --   8.4*   MG   --    --   1.5*   ALB   --   2.9*  3.7   SGOT   --   9*  12*   ALT   --   21  19   INR   --    --   1.1         Impression/Recomendations   Active Problems:    Suicide attempt by multiple drug overdose (HCC) (07/05/2014)      Insomnia (07/05/2014)  Cannabis abuse (07/05/2014)      Tobacco use disorder, mild, abuse (07/05/2014)      Obesity (BMI 30-39.9) (07/05/2014)      Overview: Body mass index is 39.83 kg/(m^2).       Bipolar 1 disorder (HCC) (07/06/2014)      Hypokalemia (07/07/2014)      Suicide Attempt by overdose  - reportedly with excedrin and tylenol and other "OTC meds"   - acetaminophen level 277 s/p mucomyst  - LFTs monitored, no changes    Bipolar 1 Disorder  - as per psych  - patient states that she did well on lithium except had body aches    Hypokalemia  - replaced prior to transfer  - recheck in AM    Tobacco Use  - counseled    Cannabis abuse  - counseled    DVT prophylaxis: not indicated    Plan of care was discussed with patient/family, nursing, and Airewele, MD. All in agreement with plan of care as outlined above.     Thank you very much for allowing us to participate in the care of this pleasant patient.   The hospitalist service will continue to follow the patient's medical progress along with you.   Please do not hesitate to page with any questions or to discuss the case.      Signed by:     Barron Alvineavid Shealeigh Dunstan, NP     July 07, 2014 at 12:34 AM

## 2014-07-07 NOTE — Behavioral Health Treatment Team (Signed)
GROUP THERAPY PROGRESS NOTE    Noralyn PickSharla Sealey is participating in Reflections.     Group time: 15 minutes    Personal goal for participation: Pt discussed her day being a roller coaster emotionally, but she did have a good visit with her boyfriend and coworker, ending her evening on a good note    Goal orientation: personal    Group therapy participation: active    Therapeutic interventions reviewed and discussed: Review unit rules and reflect on each Pt's day    Impression of participation: Pt was engaged

## 2014-07-07 NOTE — Behavioral Health Treatment Team (Addendum)
Pt resting in bed on initial round. Pt voices no concerns at this time. Staff will continue to monitor with 15 minute safety checks.    1210- Pt became upset when she could not complete call to a friend. Peers and staff tried to help her complete call but Pt became agitated and walked briskly to her room and slammed door. Pt educated on behavior expectations on General unit and encouraged to remain in her room, regain her composure and then come back out. Pt complied and writer attempted to help her make call unsuccessfully ( number appears to be blocked by callee).  Pt given the number to another friend who she successfully calls.

## 2014-07-07 NOTE — Behavioral Health Treatment Team (Addendum)
[facility]     1649 pt is agreeing to stay in hospital at this present time.

## 2014-07-07 NOTE — Behavioral Health Treatment Team (Signed)
Writer introduces pt to her roommate.  Pt walks out of the room as requested by staff to complete roommate's skin assessment.  Pt and roommate stated that they do not get alomg.  Writer along with T.W, RN attempt to mediate between roommate and pt.  Pt escalates in agitation.  Pt making racial references toward roommate.  Staff verbally redirects pt.  Pt begins screaming at staff, that she didn't do anything.  Pt attempting to engage staff in argument.  Pt states, "I came here on my own accord and I want to leave."  Call placed to Dr. Mabeline CarasHaine.  Order for TDO assessment and transfer to Acute Psychiatry obtained.  SBAR report given to [facility] Charity fundraiserN.  Pt escorted to Psychiatry ICU, escorted by staff.  Dual skin assessment completed by Clinical research associatewriter and N.C., RN.  Pt screaming during skin assessment, ripping her clothes off, encroaching upon staff's personal space in a hostile manner, physically posturing to attack staff.  Firm verbal limits given.  [facility] notified of need for TDO evaluation.  Face sheet faxed to [phone], [facility].

## 2014-07-07 NOTE — Progress Notes (Signed)
GROUP THERAPY PROGRESS NOTE    Sabrina Saunders is participating in Process Group.     Group time: 45 minutes    Group therapy participation:        Monopolizing of the group time with a very borderline present including extreme emotional reactivity, expressed inability to be alone etc. Would be a good candidate for DBT. Group discussion centered on disorder management.

## 2014-07-07 NOTE — Behavioral Health Treatment Team (Signed)
Received pt being transferred with staff. Pt is yelling and screaming out loudly. Pt continues to be increasingly angry during skin assessment. Pt is throwing her clothes angrily off while yelling and screaming at staff. " I DIDN'T DO ANYTHING WRONG! THAT BITCH WONT LISTEN TO ME! NO ONE LISTENS TO ME!" staff attempts to listen and calm pt down but pt continues to yell out and be disrespectful towards staff. Pt is administered prn PO ativan and zyprexa

## 2014-07-07 NOTE — Behavioral Health Treatment Team (Signed)
2315 Patient appearing to be sleep in bed, respirations even and unlabored. No acute distress noted at this time. Will continue to monitor patient Q15mins for safety.

## 2014-07-08 MED ORDER — METHOCARBAMOL 500 MG TAB
500 mg | Freq: Three times a day (TID) | ORAL | Status: DC
Start: 2014-07-08 — End: 2014-07-11
  Administered 2014-07-08 – 2014-07-11 (×10): via ORAL

## 2014-07-08 MED ORDER — METHOCARBAMOL 500 MG TAB
500 mg | ORAL | Status: AC
Start: 2014-07-08 — End: 2014-07-09
  Administered 2014-07-08: 18:00:00 via ORAL

## 2014-07-08 MED FILL — NICORELIEF 4 MG GUM: 4 mg | BUCCAL | Qty: 50

## 2014-07-08 MED FILL — OLANZAPINE 5 MG TAB: 5 mg | ORAL | Qty: 2

## 2014-07-08 MED FILL — PANTOPRAZOLE 40 MG TAB, DELAYED RELEASE: 40 mg | ORAL | Qty: 1

## 2014-07-08 MED FILL — LITHIUM CARBONATE SR 450 MG TAB: 450 mg | ORAL | Qty: 2

## 2014-07-08 MED FILL — METHOCARBAMOL 500 MG TAB: 500 mg | ORAL | Qty: 1

## 2014-07-08 NOTE — Progress Notes (Signed)
GROUP THERAPY PROGRESS NOTE    Noralyn PickSharla Fowers is participating in Process Group.     Group time: 45 minutes    Group therapy participation:        Actively participated in group discussion of recovery and what that might consist of for various members. Was able to ID the need to accept that one has a mental disorder in order to begin a recovery plan.

## 2014-07-08 NOTE — Behavioral Health Treatment Team (Addendum)
0335  Pt up to nsg with slow unsteady gait, appears drowsy.  VS obtained, MEWS = 1.  C/o back muscles contracting and states she needs flexeril.  Stated "I took Lithium and when I do I need flexeril because Lithium causes my muscles to contract but Lithium is the only medicine that works."  Pt also asked for a heating pad.  Agreed to be assessed by MD in the morning.  Pt ambulated back to room/bed.  Extra pillows brought to pt.  States pillow between her legs also provides some relief.  Continues on q15 min checks for safety.  Will continue to monitor and assess pt.    0400  Pt asleep in bed.     0415  Continues asleep in bed.  No further complaints of back muscle spasms.      0600  Slept 6 1/4 hrs.

## 2014-07-08 NOTE — Behavioral Health Treatment Team (Signed)
GROUP THERAPY PROGRESS NOTE    Sabrina Saunders is participating in [employer].     Group time: 15 minutes    Personal goal for participation: stay cool, calm, and collected.    Goal orientation: personal    Group therapy participation: active    Therapeutic interventions reviewed and discussed: Writer listened attentively and explained unit rules.    Impression of participation: Patient participated actively.

## 2014-07-08 NOTE — Behavioral Health Treatment Team (Signed)
Pt. Up on unit for breakfast  Less lability

## 2014-07-08 NOTE — Progress Notes (Signed)
Problem: Depressed Mood (Adult/Pediatric)  Goal: *STG: Remains safe in hospital  Outcome: Progressing Towards Goal  Patient remains safe in the hospital.

## 2014-07-08 NOTE — Behavioral Health Treatment Team (Signed)
PSYCHIATRIC PROGRESS NOTE         Patient Name  Sabrina Saunders   Date of Birth 11-27-1980   CSN [id number]   Medical Record Number  [id number]      Age  34 y.o.   PCP None   Admit date:  07/06/2014    Room Number  730/02  @ [facility]   Date of Service  07/08/2014            PSYCHOTHERAPY SESSION NOTE:  Length of psychotherapy session: 30 minutes  Main condition/diagnosis/issues treated during session today: coping skills, anger management, sobriety    Interpersonal relationship issues and psychodynamic conflicts explored. Supportive psychotherapy provided in regards to various ongoing psychosocial stressors (including some of the following):   pre-admission and current problems   Housing issues   Occupational issues   Academic issues   Legal issues   Medical issues   Interpersonal conflicts   Stress of hospitalization              Worked on issues of denial & effects of substance dependency/use  Cognitive/Behavioral therapy provided  Reality-Oriented psychotherapy provided     Overall, patient is not progressing.    An extended energy and skill set was needed to engage pt in psychotherapy due to some of the following: resistiveness, complexity, negativity, confrontational nature, hostile behaviors, and/or severe abnormalities in thought processes/psychosis resulting in the loss of expressive/receptive language communication skills.                      E & M PROGRESS NOTE:         HISTORY       CC/HISTORY OF PRESENT ILLNESS/INTERVAL HISTORY:  (reviewed/updated 07/08/2014).  The patient, Sabrina Saunders, is a 34 y.o. WHITE OR CAUCASIAN female with a past psychiatric history of , who presents at this time for an exacerbation of the principle diagnosis of Bipolar 1 disorder (HCC). Patient reports/evidences the following emotional symptoms:  depression.  The above symptoms have been present for 3 days. These symptoms are of severe severity. The symptoms are intermittent/ fleeting in nature.   Additional symptomatology include agitation . The patient's condition has been precipitated by poor treatment compliance and psychosocial stressors (problems at work ).  Condition made worse by l as treatment noncompliance.         SIDE EFFECTS: (reviewed/updated 07/08/2014)  None reported or admitted to.  No noted toxicity with use of Depakote/Tegretol/lithium/Clozaril/TCAs   ALLERGIES:(reviewed/updated 07/08/2014)  Allergies   Allergen Reactions   ??? Depakote [Divalproex] Other (comments)      MEDICATIONS PRIOR TO ADMISSION:(reviewed/updated 07/08/2014)  Prescriptions prior to admission Medication Sig   ??? pantoprazole (PROTONIX) 40 mg tablet Take 40 mg by mouth daily. Indications: HEARTBURN   ??? nicotine (NICODERM CQ) 7 mg/24 hr 1 Patch by TransDERmal route daily as needed (Tobacco crave) for up to 30 days.   ??? sucralfate (CARAFATE) 100 mg/mL suspension Take 10 mL by mouth three (3) times daily as needed (indigestion).      PAST MEDICAL HISTORY: Past medical history from the initial psychiatric evaluation has been reviewed (reviewed/updated 07/08/2014) with no additional updates (I asked patient and no additional past medical history provided). Past Medical History   Diagnosis Date   ??? Ectopic pregnancy    ??? Depression    ??? Sleep disorder  ??? Substance abuse    ??? Suicidal thoughts    ??? Ill-defined condition    History reviewed. No pertinent past  surgical history.   SOCIAL HISTORY: Social history from the initial psychiatric evaluation has been reviewed (reviewed/updated 07/08/2014) with no additional updates (I asked patient and no additional social history provided). History     Social History   ??? Marital Status: SINGLE     Spouse Name: N/A   ??? Number of Children: N/A   ??? Years of Education: N/A     Occupational History   ??? Not on file.     Social History Main Topics   ??? Smoking status: Current Every Day Smoker   ??? Smokeless tobacco: Not on file   ??? Alcohol Use: Yes   ??? Drug Use: Yes     Special: OTC, Marijuana    ??? Sexual Activity: Not on file     Other Topics Concern   ??? Not on file     Social History Narrative    34 year old single caucasian female voluntarily admitted from [facility] (where she had a medical admission following an intentional overdose of Tylenol and Exedrin). 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 After this happened she took the overdose and called a friend to bring hrt to the ED.  Pt. Regretted the OD. She has 3 sons ages 5,11,15, of whom she does not have custody. Pt. Chronically resides in hotels.      FAMILY HISTORY: Family history from the initial psychiatric evaluation has been reviewed (reviewed/updated 07/08/2014) with no additional updates (I asked patient and no additional family history provided). History reviewed. No pertinent family history.    REVIEW OF SYSTEMS: (reviewed/updated 07/08/2014)  Appetite:   Sleep: decreased more than normal   All other Review of Systems:      Psychological ROS: positive for - anxiety  Respiratory ROS: no cough, shortness of breath, or wheezing  Cardiovascular ROS: no chest pain or dyspnea on exertion         MENTAL STATUS EXAM & VITALS     MENTAL STATUS EXAM (MSE):    MSE FINDINGS ARE WITHIN NORMAL LIMITS (WNL) UNLESS OTHERWISE STATED BELOW.    Orientation oriented to time, place and person   Vital Signs (BP,Pulse, Temp) See below (reviewed 07/08/2014); vital signs are WNL if not listed below.   Gait and Station Stable/steady, no ataxia   Abnormal Muscular Movements, Tone, and Behavior No EPS, no Tardive Dyskinesia, no abnormal muscular movements; wnl tone   Relations cooperative   General Appearance:  age appropriate and overweight   Language No aphasia or dysarthria Speech:  normal volume   Thought Processes logical, wnl rate of thoughts, good abstract reasoning and computation   Thought Associations normal   Thought Content not internally preoccupied    Suicidal Ideations none   Homicidal Ideations none   Mood:  hostile    Affect:  full range   Memory recent  adequate   Memory remote:  adequate   Concentration/Attention:  adequate   Fund of Knowledge average   Insight:  limited   Reliability poor   Judgment:  limited        VITALS:     Patient Vitals for the past 24 hrs:   Temp Pulse Resp BP SpO2   07/08/14 0731 97.7 ??F (36.5 ??C) 78 18 103/71 mmHg 98 %   07/08/14 0330 97.7 ??F (36.5 ??C) 75 16 127/64 mmHg 98 %   07/07/14 2034 98.2 ??F (36.8 ??C) 75 18 111/76 mmHg 98 %   07/07/14 1605 98.6 ??F (37 ??C) 67 18  111/69 mmHg 97 %            DATA     LABORATORY DATA:(reviewed/updated 07/08/2014)  No results found for this or any previous visit (from the past 24 hour(s)).  No results found for: VALF2, VALAC, VALP, VALPR, DS6, CRBAM, CRBAMP, CARB2, XCRBAM  No results found for: LI, LIH, LITHM   RADIOLOGY REPORTS:(reviewed/updated 07/08/2014)  No results found.       MEDICATIONS     ALL MEDICATIONS:   Current Facility-Administered Medications   Medication Dose Route Frequency   ??? methocarbamol (ROBAXIN) tablet 500 mg  500 mg Oral TID   ??? lithium carbonate CR (ESKALITH CR) tablet 900 mg  900 mg Oral DAILY   ??? OLANZapine (ZyPREXA) tablet 10 mg  10 mg Oral QHS   ??? nicotine (NICORETTE) gum 4 mg  4 mg Oral Q2H PRN   ??? ziprasidone (GEODON) 20 mg in sterile water (preservative free) injection  20 mg IntraMUSCular BID PRN   ??? OLANZapine (ZyPREXA) tablet 5 mg  5 mg Oral Q6H PRN   ??? benztropine (COGENTIN) tablet 2 mg  2 mg Oral BID PRN   ??? benztropine (COGENTIN) injection 2 mg  2 mg IntraMUSCular Q12H PRN   ??? LORazepam (ATIVAN) injection 2 mg  2 mg IntraMUSCular Q4H PRN   ??? LORazepam (ATIVAN) tablet 1 mg  1 mg Oral Q4H PRN   ??? acetaminophen (TYLENOL) tablet 650 mg  650 mg Oral Q4H PRN   ??? magnesium hydroxide (MILK OF MAGNESIA) oral suspension 30 mL  30 mL Oral DAILY PRN   ??? pantoprazole (PROTONIX) tablet 40 mg  40 mg Oral ACB   ??? sucralfate (CARAFATE) 100 mg/mL oral suspension 1 g  1,000 mg Oral TID PRN   ??? zolpidem (AMBIEN) tablet 5 mg  5 mg Oral QHS PRN       SCHEDULED MEDICATIONS:   Current Facility-Administered Medications   Medication Dose Route Frequency   ??? methocarbamol (ROBAXIN) tablet 500 mg  500 mg Oral TID   ??? lithium carbonate CR (ESKALITH CR) tablet 900 mg  900 mg Oral DAILY   ??? OLANZapine (ZyPREXA) tablet 10 mg  10 mg Oral QHS ??? pantoprazole (PROTONIX) tablet 40 mg  40 mg Oral ACB            ASSESSMENT & PLAN     The patient, Sabrina Saunders, is a 34 y.o.  female who presents at this time for treatment of the following diagnoses: (reviewed/updated 07/08/2014)  Patient Active Hospital Problem List:   Bipolar 1 disorder (HCC) (07/06/2014)    Assessment:mania alternating with depression      Plan: lithium           In summary, Sabrina Saunders presents with a severe exacerbation of the principal diagnosis, Bipolar 1 disorder (HCC), which is worsening/not improving/not stable .    Patient requires continued inpatient hospitalization for further stabilization. I will continue to coordinate the provision of individual, milieu, occupational, group, and substance abuse therapies to address target symptoms/diagnoses as deemed appropriate for the individual patient.     I will continue to monitor blood levels (Depakote, Tegretol, lithium, clozapine---a drug with a narrow therapeutic index= NTI) and associated labs for drug therapy implemented that require intense monitoring for toxicity as deemed appropriate base on current medication side effects and pharmacodynamically determined drug 1/2 lives.       A coordinated, multidisplinary treatment team round was conducted with the patient---this is done daily [facility] .  This team consists of the nurse, psychiatric unit pharmcist, Administrator.     The following regarding medications was addressed during rounds with patient:   the risks and benefits of the proposed medication. The patient was given the opportunity to ask questions. Informed consent given to the use of the  above medications. Will continue to adjust psychiatric and non-psychiatric medications (see above "medication" section and orders section for details) as deemed appropriate & based upon diagnoses and response to treatment.     I will continue to order blood tests/labs and diagnostic tests as deemed appropriate and review results as they become available (see orders for details and above listed lab/test results).    I will order psychiatric records from previous psych hospitals to further elucidate the nature of patient's psychopathology and review once available.    I will gather additional collateral information from friends, family and o/p treatment team to further elucidate the nature of patient's psychopathology and baselline level of psychiatric functioning.    Complete current electronic health record for patient has been reviewed today including consultant notes, ancillary staff notes, nurses and psychiatric tech notes    I certify that this patient's inpatient psychiatric hospital services furnished since the previous certification were, and continue to be, required for treatment that could reasonably be expected to improve the patient's condition, or for diagnostic study, and that the patient continues to need, on a daily basis, active treatment furnished directly by or requiring the supervision of inpatient psychiatric facility personnel. In addition, the hospital records show that services furnished were intensive treatment services, admission or related services, or equivalent services.    EXPECTED DISCHARGE DATE/DAY: TBD     DISPOSITION: Home       Signed By:   Cheryll Cockayne, MD  07/08/2014

## 2014-07-08 NOTE — Behavioral Health Treatment Team (Addendum)
Received patient awake in bed talking to roommate. Patient expressed need for coffee. NAD. Q-15 checks for safety.    1600: Patient is medication compliant. Pt going to groups and socializing with peers. NAD. Q-15 checks for safety.
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
## 2014-07-08 NOTE — Behavioral Health Treatment Team (Signed)
Pt is visible on unit with peers laughing, talking and dancing appropriately with select peers. Staff will continue to monitor q 15 min checks.

## 2014-07-09 MED ORDER — LITHIUM CARBONATE SR 300 MG TAB
300 mg | Freq: Every evening | ORAL | Status: DC
Start: 2014-07-09 — End: 2014-07-10

## 2014-07-09 MED ORDER — LITHIUM CARBONATE 300 MG CAP
300 mg | Freq: Three times a day (TID) | ORAL | Status: DC
Start: 2014-07-09 — End: 2014-07-09

## 2014-07-09 MED ORDER — LITHIUM CARBONATE 300 MG CAP
300 mg | Freq: Every evening | ORAL | Status: DC
Start: 2014-07-09 — End: 2014-07-09

## 2014-07-09 MED FILL — METHOCARBAMOL 500 MG TAB: 500 mg | ORAL | Qty: 1

## 2014-07-09 MED FILL — LITHIUM CARBONATE SR 300 MG TAB: 300 mg | ORAL | Qty: 4

## 2014-07-09 MED FILL — LORAZEPAM 1 MG TAB: 1 mg | ORAL | Qty: 1

## 2014-07-09 MED FILL — OLANZAPINE 5 MG TAB: 5 mg | ORAL | Qty: 2

## 2014-07-09 MED FILL — PANTOPRAZOLE 40 MG TAB, DELAYED RELEASE: 40 mg | ORAL | Qty: 1

## 2014-07-09 MED FILL — LITHIUM CARBONATE SR 450 MG TAB: 450 mg | ORAL | Qty: 2

## 2014-07-09 MED FILL — ACETAMINOPHEN 325 MG TABLET: 325 mg | ORAL | Qty: 2

## 2014-07-09 MED FILL — NICORELIEF 4 MG GUM: 4 mg | BUCCAL | Qty: 50

## 2014-07-09 MED FILL — ZOLPIDEM 5 MG TAB: 5 mg | ORAL | Qty: 1

## 2014-07-09 NOTE — Other (Addendum)
Behavioral Health Interdisciplinary Rounds     Patient Name:  Sabrina Saunders  Age:  34 y.o.  Room/Bed:  730/02  Primary Diagnosis:  Bipolar 1 disorder (HCC)   Admission Status:  Voluntary     Readmission within 30 days:  no  Power of Attorney in Place:  no    VTE Prophylaxis: Not indicated  Influenza Vaccine Administered and/or Documentation completed?:  yes  Pneumococcal Vaccine Administered and/or Documentation completed?:  yes  Mobility Needs:  no    Nutritional Plan:  no  Consults:no          Labs/Testing due today?:  Plan for labs on Wednesday    Sleep hours: 5.25       Participation in Care/Groups:  yes  Medication Compliant?:  Yes  PRNS (last 24 hours): Sleep Aid    Restraints (last 24 hours):  no  Substance Abuse:  no  CIWA (range last 24 hours):  COWS range last 24 hours):     Goal(s) for today: To be calm and to learn more about DBT.  LOS:  3  Expected LOS: plan for discharge this week  Financial Concerns: no  Date of last family contact:      Family Requesting Physician Contact today:    Discharge plan: Patient discussed going to DBT and also going back to work.         Outpatient provider(s):TBD    Treatment team members present for discussion: Dr. Mabeline CarasHaine , Lanae Crumblyina Bishop, SW Baird Cancerarolyn Framer, RN and Sabrina Saunders.

## 2014-07-09 NOTE — Behavioral Health Treatment Team (Signed)
GROUP THERAPY PROGRESS NOTE    Noralyn PickSharla Saladin is participating in [facility].     Group time: 15 minutes    Personal goal for participation: work on staying positive and focus on my recovery.    Goal orientation: personal    Group therapy participation: active    Therapeutic interventions reviewed and discussed: Writer listened attentively and explained unit rules.    Impression of participation: Patient participated actively.

## 2014-07-09 NOTE — Behavioral Health Treatment Team (Signed)
1940 pt complaining of increased anxiety from the unit and a female peer. Pt states " I need more interaction and groups. I don't know why they sent me over here because I need to be back on the other side". Pt is requesting to have dr change lithium lab work for tomorrow. Staff explains to pt that she can speak to dr tomorrow about having labs changed and possibly boarding for groups on general. Writer administers prn ativan to help with anxiety.

## 2014-07-09 NOTE — Behavioral Health Treatment Team (Signed)
Received pt resting quietly in bed, no acute distress noted at this time. Staff will continue to monitor q 15 min checks.

## 2014-07-09 NOTE — Other (Signed)
Pt given prn of ambien for sleep.

## 2014-07-09 NOTE — Behavioral Health Treatment Team (Signed)
GROUP THERAPY PROGRESS NOTE    Noralyn PickSharla Medici is participating in [facility].     Group time: 30 minutes    Personal goal for participation: relaxation    Goal orientation: personal    Group therapy participation: active    Therapeutic interventions reviewed and discussed: puzzles and pictures to color    Impression of participation: Participated well. Seems in fair spirits.

## 2014-07-09 NOTE — Behavioral Health Treatment Team (Addendum)
2110 Pt received Ambien 5 mg po prn per request to promote sleep. Will monitor.    2315 Pt appears asleep in bed during initial rounds. Respirations even and unlabored. NAD noted. Will continue to monitor with Q 15 safety checks.     0225 Pt received Tylenol 650 mg po prn for back pain 8/10. Vitals stable. Will monitor.    0305 Pt appears asleep. Respirations even and unlabored.

## 2014-07-09 NOTE — Behavioral Health Treatment Team (Addendum)
0530  Pt states she has been off her lithium for "over a year."  Has returned to her bed.    0600  Up to nsg, asked and was provided with ginger ale to drink.  Asked when her Robaxin was scheduled and received this information from the medication nurse.  Returned to bed.      Pt slept 5 1/4 hrs.

## 2014-07-09 NOTE — Progress Notes (Signed)
Patient seen and examined at bedside earlier today.  She reports some back spasms, for which she is receiving methocarbamol (she had mistakenly stated she was getting baclofen for it).  It is improving.  She otherwise just feels like she is getting used to her psychotropic medications.    Visit Vitals   Item Reading   ??? BP 129/69 mmHg   ??? Pulse 76   ??? Temp 97.5 ??F (36.4 ??C)   ??? Resp 20   ??? Wt 107.956 kg (238 lb)   ??? SpO2 97%   ??? Breastfeeding No     General: no acute distress  HEENT: MMM, NCAT, PERRL, EOMI  Chest/lungs: CTABL  Heart: S1S2+, RRR, no murmurs  Abd: soft, non-tender, non-distended, +normoactive bowel sounds  Ext: no edema  Neuro: non-focal    Assess: 34 year old female with bipolar disorder, overdose, hypokalemia    Recommendations:   - hypokalemia has now resolved after repletion   - care as per psychiatry   - will sign off, please call with any additional questions    Suzan GaribaldiKhoi A Lanissa Cashen, MD  07/09/2014  19:35

## 2014-07-09 NOTE — Behavioral Health Treatment Team (Signed)
PSYCHIATRIC PROGRESS NOTE         Patient Name  Sabrina Saunders   Date of Birth [birth year]   CSN [id number]   Medical Record Number  [id number]      Age  34 y.o.   PCP None   Admit date:  07/06/2014    Room Number  730/02  @ [facility]   Date of Service  07/09/2014            PSYCHOTHERAPY SESSION NOTE:  Length of psychotherapy session: 30 minutes  Main condition/diagnosis/issues treated during session today: coping skills, anger management, sobriety    Interpersonal relationship issues and psychodynamic conflicts explored. Supportive psychotherapy provided in regards to various ongoing psychosocial stressors (including some of the following):   pre-admission and current problems   Housing issues   Occupational issues   Academic issues   Legal issues   Medical issues   Interpersonal conflicts   Stress of hospitalization              Worked on issues of denial & effects of substance dependency/use  Cognitive/Behavioral therapy provided  Reality-Oriented psychotherapy provided     Overall, patient is not progressing.    An extended energy and skill set was needed to engage pt in psychotherapy due to some of the following: resistiveness, complexity, negativity, confrontational nature, hostile behaviors, and/or severe abnormalities in thought processes/psychosis resulting in the loss of expressive/receptive language communication skills.                      E & M PROGRESS NOTE:         HISTORY       CC/HISTORY OF PRESENT ILLNESS/INTERVAL HISTORY:  (reviewed/updated 07/09/2014).  The patient, Sabrina Saunders, is a 34 y.o. WHITE OR CAUCASIAN female with a past psychiatric history of , who presents at this time for an exacerbation of the principle diagnosis of Bipolar 1 disorder (HCC). Patient reports/evidences the following emotional symptoms:  depression.  The above symptoms have been present for 3 days. These symptoms are of severe severity. The symptoms are intermittent/ fleeting in nature.   Additional symptomatology include agitation . The patient's condition has been precipitated by poor treatment compliance and psychosocial stressors (problems at work ).  Condition made worse by l as treatment noncompliance.   07/09/14- Complying with medications, no side effects. Still has pressured speech and very bright affect.         SIDE EFFECTS: (reviewed/updated 07/09/2014)  None reported or admitted to.  No noted toxicity with use of Depakote/Tegretol/lithium/Clozaril/TCAs   ALLERGIES:(reviewed/updated 07/09/2014)  Allergies   Allergen Reactions   ??? Depakote [Divalproex] Other (comments)      MEDICATIONS PRIOR TO ADMISSION:(reviewed/updated 07/09/2014)  Prescriptions prior to admission   Medication Sig   ??? pantoprazole (PROTONIX) 40 mg tablet Take 40 mg by mouth daily. Indications: HEARTBURN   ??? nicotine (NICODERM CQ) 7 mg/24 hr 1 Patch by TransDERmal route daily as needed (Tobacco crave) for up to 30 days.   ??? sucralfate (CARAFATE) 100 mg/mL suspension Take 10 mL by mouth three (3) times daily as needed (indigestion).      PAST MEDICAL HISTORY: Past medical history from the initial psychiatric evaluation has been reviewed (reviewed/updated 07/09/2014) with no additional updates (I asked patient and no additional past medical history provided).   Past Medical History   Diagnosis Date   ??? Ectopic pregnancy    ??? Depression    ??? Sleep disorder    ???  Substance abuse    ??? Suicidal thoughts  ??? Ill-defined condition    History reviewed. No pertinent past surgical history.   SOCIAL HISTORY: Social history from the initial psychiatric evaluation has been reviewed (reviewed/updated 07/09/2014) with no additional updates (I asked patient and no additional social history provided).   History     Social History   ??? Marital Status: SINGLE     Spouse Name: N/A   ??? Number of Children: N/A   ??? Years of Education: N/A     Occupational History   ??? Not on file.     Social History Main Topics   ??? Smoking status: Current Every Day Smoker    ??? Smokeless tobacco: Not on file   ??? Alcohol Use: Yes   ??? Drug Use: Yes     Special: OTC, Marijuana   ??? Sexual Activity: Not on file     Other Topics Concern   ??? Not on file     Social History Narrative    34 year old single caucasian female [facility]where [facility] medical admission following an intentional overdose of Tylenol and Exedrin). Pt. 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 After this happened she took the overdose and called a friend to bring hrt to the ED.  Pt. Regretted the OD. She has 3 sons ages 194,11,15, of whom she does not have custody. Pt. Chronically resides in hotels.      FAMILY HISTORY: Family history from the initial psychiatric evaluation has been reviewed (reviewed/updated 07/09/2014) with no additional updates (I asked patient and no additional family history provided). History reviewed. No pertinent family history.    REVIEW OF SYSTEMS: (reviewed/updated 07/09/2014)  Appetite:   Sleep: decreased more than normal   All other Review of Systems:      Psychological ROS: positive for - anxiety  Respiratory ROS: no cough, shortness of breath, or wheezing  Cardiovascular ROS: no chest pain or dyspnea on exertion         MENTAL STATUS EXAM & VITALS     MENTAL STATUS EXAM (MSE):    MSE FINDINGS ARE WITHIN NORMAL LIMITS (WNL) UNLESS OTHERWISE STATED BELOW.    Orientation oriented to time, place and person   Vital Signs (BP,Pulse, Temp) See below (reviewed 07/09/2014); vital signs are WNL if not listed below.   Gait and Station Stable/steady, no ataxia   Abnormal Muscular Movements, Tone, and Behavior No EPS, no Tardive Dyskinesia, no abnormal muscular movements; wnl tone   Relations cooperative   General Appearance:  age appropriate and overweight   Language No aphasia or dysarthria   Speech:  normal volume   Thought Processes logical, wnl rate of thoughts, good abstract reasoning and computation   Thought Associations normal    Thought Content not internally preoccupied    Suicidal Ideations none   Homicidal Ideations none   Mood:  hostile   Affect:  full range   Memory recent  adequate   Memory remote:  adequate   Concentration/Attention:  adequate   Fund of Knowledge average   Insight:  limited   Reliability poor   Judgment:  limited        VITALS:     Patient Vitals for the past 24 hrs:   Temp Pulse Resp BP SpO2   07/09/14 0721 97.7 ??F (36.5 ??C) 78 16 102/69 mmHg 98 %   07/09/14 0221 97.9 ??F (36.6 ??C) 90 18 119/79 mmHg 98 %  07/08/14 2015 98.3 ??F (36.8 ??C) 75 18 124/74 mmHg -   07/08/14 1549 99.5 ??F (37.5 ??C) 83 20 93/62 mmHg 98 %   07/08/14 1200 97.4 ??F (36.3 ??C) 80 16 126/86 mmHg 97 %            DATA     LABORATORY DATA:(reviewed/updated 07/09/2014)  No results found for this or any previous visit (from the past 24 hour(s)).  No results found for: VALF2, VALAC, VALP, VALPR, DS6, CRBAM, CRBAMP, CARB2, XCRBAM  No results found for: LI, LIH, LITHM   RADIOLOGY REPORTS:(reviewed/updated 07/09/2014)  No results found.       MEDICATIONS     ALL MEDICATIONS:   Current Facility-Administered Medications   Medication Dose Route Frequency   ??? lithium carbonate SR (LITHOBID) tablet 1,200 mg  1,200 mg Oral QHS   ??? methocarbamol (ROBAXIN) tablet 500 mg  500 mg Oral TID   ??? OLANZapine (ZyPREXA) tablet 10 mg  10 mg Oral QHS   ??? nicotine (NICORETTE) gum 4 mg  4 mg Oral Q2H PRN   ??? ziprasidone (GEODON) 20 mg in sterile water (preservative free) injection  20 mg IntraMUSCular BID PRN   ??? OLANZapine (ZyPREXA) tablet 5 mg  5 mg Oral Q6H PRN   ??? benztropine (COGENTIN) tablet 2 mg  2 mg Oral BID PRN   ??? benztropine (COGENTIN) injection 2 mg  2 mg IntraMUSCular Q12H PRN   ??? LORazepam (ATIVAN) injection 2 mg  2 mg IntraMUSCular Q4H PRN   ??? LORazepam (ATIVAN) tablet 1 mg  1 mg Oral Q4H PRN   ??? acetaminophen (TYLENOL) tablet 650 mg  650 mg Oral Q4H PRN   ??? magnesium hydroxide (MILK OF MAGNESIA) oral suspension 30 mL  30 mL Oral DAILY PRN    ??? pantoprazole (PROTONIX) tablet 40 mg  40 mg Oral ACB   ??? sucralfate (CARAFATE) 100 mg/mL oral suspension 1 g  1,000 mg Oral TID PRN   ??? zolpidem (AMBIEN) tablet 5 mg  5 mg Oral QHS PRN      SCHEDULED MEDICATIONS:   Current Facility-Administered Medications   Medication Dose Route Frequency   ??? lithium carbonate SR (LITHOBID) tablet 1,200 mg  1,200 mg Oral QHS   ??? methocarbamol (ROBAXIN) tablet 500 mg  500 mg Oral TID   ??? OLANZapine (ZyPREXA) tablet 10 mg  10 mg Oral QHS   ??? pantoprazole (PROTONIX) tablet 40 mg  40 mg Oral ACB            ASSESSMENT & PLAN     The patient, Sabrina Saunders, is a 34 y.o.  female who presents at this time for treatment of the following diagnoses: (reviewed/updated 07/09/2014)  Patient Active Hospital Problem List:   Bipolar 1 disorder (HCC) (07/06/2014)    Assessment:mania alternating with depression      Plan: lithium           In summary, Sabrina Saunders presents with a severe exacerbation of the principal diagnosis, Bipolar 1 disorder (HCC), which is worsening/not improving/not stable .    Patient requires continued inpatient hospitalization for further stabilization. I will continue to coordinate the provision of individual, milieu, occupational, group, and substance abuse therapies to address target symptoms/diagnoses as deemed appropriate for the individual patient.     I will continue to monitor blood levels (Depakote, Tegretol, lithium, clozapine---a drug with a narrow therapeutic index= NTI) and associated labs for drug therapy implemented that require intense monitoring for toxicity as deemed appropriate base on  current medication side effects and pharmacodynamically determined drug 1/2 lives.       A coordinated, multidisplinary treatment team round was conducted with the patient---this is done daily here at [facility] . This team consists of the nurse, psychiatric unit pharmcist, social worker and Clinical research associate.      The following regarding medications was addressed during rounds with patient:   the risks and benefits of the proposed medication. The patient was given the opportunity to ask questions. Informed consent given to the use of the above medications. Will continue to adjust psychiatric and non-psychiatric medications (see above "medication" section and orders section for details) as deemed appropriate & based upon diagnoses and response to treatment.     I will continue to order blood tests/labs and diagnostic tests as deemed appropriate and review results as they become available (see orders for details and above listed lab/test results).    I will order psychiatric records from previous psych hospitals to further elucidate the nature of patient's psychopathology and review once available.    I will gather additional collateral information from friends, family and o/p treatment team to further elucidate the nature of patient's psychopathology and baselline level of psychiatric functioning.    Complete current electronic health record for patient has been reviewed today including consultant notes, ancillary staff notes, nurses and psychiatric tech notes    I certify that this patient's inpatient psychiatric hospital services furnished since the previous certification were, and continue to be, required for treatment that could reasonably be expected to improve the patient's condition, or for diagnostic study, and that the patient continues to need, on a daily basis, active treatment furnished directly by or requiring the supervision of inpatient psychiatric facility personnel. In addition, the hospital records show that services furnished were intensive treatment services, admission or related services, or equivalent services.    EXPECTED DISCHARGE DATE/DAY: TBD     DISPOSITION: Home       Signed By:   Cheryll Cockayne, MD  07/09/2014

## 2014-07-09 NOTE — Behavioral Health Treatment Team (Signed)
Pt. 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  Medication compliant  "I did pretty good until about 3 am this morning then my back started hurting again. Pt. Denies suicidal ideation on 15 min. Checks for safety.

## 2014-07-09 NOTE — Progress Notes (Signed)
GROUP THERAPY PROGRESS NOTE    Sabrina PickSharla Saunders is participating in Process Group.     Group time: 45 minutes    Group therapy participation:        Harle StanfordSharla reported feeling good today. She participated actively in group and seemed in good spirits.

## 2014-07-10 LAB — METABOLIC PANEL, BASIC
Anion gap: 8 mmol/L (ref 5–15)
BUN/Creatinine ratio: 15 (ref 12–20)
BUN: 10 MG/DL (ref 6–20)
CO2: 28 mmol/L (ref 21–32)
Calcium: 8.7 MG/DL (ref 8.5–10.1)
Chloride: 103 mmol/L (ref 97–108)
Creatinine: 0.65 MG/DL (ref 0.55–1.02)
GFR est AA: >60 mL/min/{1.73_m2} (ref >60)
GFR est non-AA: >60 mL/min/{1.73_m2} (ref >60)
Glucose: 100 mg/dL (ref 65–100)
Potassium: 3.9 mmol/L (ref 3.5–5.1)
Sodium: 139 mmol/L (ref 136–145)

## 2014-07-10 LAB — LITHIUM: Lithium level: 0.43 MMOL/L — ABNORMAL LOW (ref 0.60–1.20)

## 2014-07-10 MED ORDER — LITHIUM CARBONATE 300 MG CAP
300 mg | Freq: Every day | ORAL | Status: DC
Start: 2014-07-10 — End: 2014-07-11
  Administered 2014-07-11: 12:00:00 via ORAL

## 2014-07-10 MED ORDER — LITHIUM CARBONATE SR 450 MG TAB
450 mg | Freq: Every day | ORAL | Status: DC
Start: 2014-07-10 — End: 2014-07-10

## 2014-07-10 MED ORDER — LITHIUM CARBONATE SR 450 MG TAB
450 mg | Freq: Every day | ORAL | Status: DC
Start: 2014-07-10 — End: 2014-07-10
  Administered 2014-07-10: 17:00:00 via ORAL

## 2014-07-10 MED FILL — ACETAMINOPHEN 325 MG TABLET: 325 mg | ORAL | Qty: 2

## 2014-07-10 MED FILL — PANTOPRAZOLE 40 MG TAB, DELAYED RELEASE: 40 mg | ORAL | Qty: 1

## 2014-07-10 MED FILL — ZOLPIDEM 5 MG TAB: 5 mg | ORAL | Qty: 1

## 2014-07-10 MED FILL — NICORELIEF 4 MG GUM: 4 mg | BUCCAL | Qty: 50

## 2014-07-10 MED FILL — METHOCARBAMOL 500 MG TAB: 500 mg | ORAL | Qty: 1

## 2014-07-10 MED FILL — LITHIUM CARBONATE SR 450 MG TAB: 450 mg | ORAL | Qty: 2

## 2014-07-10 NOTE — Progress Notes (Signed)
Problem: Depressed Mood (Adult/Pediatric)  Goal: *LTG: Returns to previous level of functioning and participates with after care plan  Outcome: Progressing Towards Goal  Patient has been medication compliant.

## 2014-07-10 NOTE — Behavioral Health Treatment Team (Signed)
Has appeared to sleep 7+ hours , NAD noted

## 2014-07-10 NOTE — Progress Notes (Signed)
Laboratory Monitoring for Atypical Antipsychotics and Mood Stabilizers    This patient is currently prescribed the following medication(s):   Current Facility-Administered Medications   Medication Dose Route Frequency   ??? lithium carbonate CR (ESKALITH CR) tablet 900 mg  900 mg Oral DAILY   ??? methocarbamol (ROBAXIN) tablet 500 mg  500 mg Oral TID   ??? OLANZapine (ZyPREXA) tablet 10 mg  10 mg Oral QHS   ??? pantoprazole (PROTONIX) tablet 40 mg  40 mg Oral ACB       The following labs have been completed for monitoring of lithium:    Lithium Serum Concentration  Lab Results   Component Value Date/Time    LITHIUM 0.43 07/10/2014 09:51 AM         Renal Function and Chemistry  Lab Results   Component Value Date/Time    SODIUM 139 07/10/2014 09:51 AM    POTASSIUM 3.9 07/10/2014 09:51 AM    CHLORIDE 103 07/10/2014 09:51 AM    CO2 28 07/10/2014 09:51 AM    ANION GAP 8 07/10/2014 09:51 AM    BUN 10 07/10/2014 09:51 AM    CREATININE 0.65 07/10/2014 09:51 AM    BUN/CREATININE RATIO 15 07/10/2014 09:51 AM       Assessment/Plan:  Lithium serum concentration is below therapeutic range, though of note the level was drawn ~13 hours post dose. Plan to increase dose. BMP is within normal limits. No further laboratory monitoring for lithium is needed at this time.        Lorin GlassKatie Adams, PharmD, BCPS, BCPP  [phone]

## 2014-07-10 NOTE — Progress Notes (Signed)
GROUP THERAPY PROGRESS NOTE    Sabrina Saunders     Group time: 45 minutes    Goal orientation: personal    Impression of participation:       Harle StanfordSharla was highly verbal and had difficulty with goal directed thoughts in group.  She discussed going off her medication and becoming symptomatic about six months ago.  She also reported having a supportive boss and coworkers.      Shanda HowellsSarah R Demm, PsyD  07/10/2014

## 2014-07-10 NOTE — Behavioral Health Treatment Team (Addendum)
2310 Pt appears asleep in bed during initial rounds. Respirations even and unlabored. NAD noted. Will continue to monitor with Q 15 safety checks.     0510 Pt complaining of back pain 8/10, received tylenol prn. Vitals stable. Pt expressing concerns about change in Lithium and requesting to attend group on general. Encouraged pt to share feelings with treatment team.    0540 Pt appears asleep in bed. Respirations even and unlabored.

## 2014-07-10 NOTE — Behavioral Health Treatment Team (Addendum)
2315 Pt awake in bed during initial rounds. Pt received Ativan 1 mg po prn per request for anxiety/restlessness. Vitals stable. NAD noted. Will continue to monitor with Q 15 safety checks.

## 2014-07-10 NOTE — Behavioral Health Treatment Team (Signed)
GROUP THERAPY PROGRESS NOTE    Noralyn PickSharla Salido is participating in Reflections.     Group time: 30 minutes    Personal goal for participation: relaxation    Goal orientation: personal    Group therapy participation: active    Therapeutic interventions reviewed and discussed: puzzles and pictures to color    Impression of participation: Participated actively. Seems in fair spirits.

## 2014-07-10 NOTE — Behavioral Health Treatment Team (Signed)
PSYCHIATRIC PROGRESS NOTE         Patient Name  Sabrina Saunders   Date of Birth Aug 24, 1980   CSN [id number]   Medical Record Number  [id number]      Age  34 y.o.   PCP None   Admit date:  07/06/2014    Room Number  730/02  @ [facility]   Date of Service  07/10/2014            PSYCHOTHERAPY SESSION NOTE:  Length of psychotherapy session: 30 minutes  Main condition/diagnosis/issues treated during session today: coping skills, anger management, sobriety    Interpersonal relationship issues and psychodynamic conflicts explored. Supportive psychotherapy provided in regards to various ongoing psychosocial stressors (including some of the following):   pre-admission and current problems   Housing issues   Occupational issues   Academic issues   Legal issues   Medical issues   Interpersonal conflicts   Stress of hospitalization              Worked on issues of denial & effects of substance dependency/use  Cognitive/Behavioral therapy provided  Reality-Oriented psychotherapy provided     Overall, patient is not progressing.    An extended energy and skill set was needed to engage pt in psychotherapy due to some of the following: resistiveness, complexity, negativity, confrontational nature, hostile behaviors, and/or severe abnormalities in thought processes/psychosis resulting in the loss of expressive/receptive language communication skills.                      E & M PROGRESS NOTE:         HISTORY       CC/HISTORY OF PRESENT ILLNESS/INTERVAL HISTORY:  (reviewed/updated 07/10/2014).  The patient, Sabrina Saunders, is a 34 y.o. WHITE OR CAUCASIAN female with a past psychiatric history of , who presents at this time for an exacerbation of the principle diagnosis of Bipolar 1 disorder (HCC). Patient reports/evidences the following emotional symptoms:  depression.  The above symptoms have been present for 3 days. These symptoms are of severe severity. The symptoms are intermittent/ fleeting in nature.   Additional symptomatology include agitation . The patient's condition has been precipitated by poor treatment compliance and psychosocial stressors (problems at work ).  Condition made worse by l as treatment noncompliance.   07/09/14- Complying with medications, no side effects. Still has pressured speech and very bright affect.   07/10/14- pleasant, with less pressured speech, but slightly euphoric. (Wants "to hug" everyone). Denies side effects.        SIDE EFFECTS: (reviewed/updated 07/10/2014)  None reported or admitted to.  No noted toxicity with use of Depakote/Tegretol/lithium/Clozaril/TCAs   ALLERGIES:(reviewed/updated 07/10/2014)  Allergies   Allergen Reactions   ??? Depakote [Divalproex] Other (comments)      MEDICATIONS PRIOR TO ADMISSION:(reviewed/updated 07/10/2014)  Prescriptions prior to admission   Medication Sig   ??? pantoprazole (PROTONIX) 40 mg tablet Take 40 mg by mouth daily. Indications: HEARTBURN   ??? nicotine (NICODERM CQ) 7 mg/24 hr 1 Patch by TransDERmal route daily as needed (Tobacco crave) for up to 30 days.   ??? sucralfate (CARAFATE) 100 mg/mL suspension Take 10 mL by mouth three (3) times daily as needed (indigestion).      PAST MEDICAL HISTORY: Past medical history from the initial psychiatric evaluation has been reviewed (reviewed/updated 07/10/2014) with no additional updates (I asked patient and no additional past medical history provided).   Past Medical History   Diagnosis Date   ???  Ectopic pregnancy    ??? Depression    ??? Sleep disorder    ??? Substance abuse    ??? Suicidal thoughts    ??? Ill-defined condition  History reviewed. No pertinent past surgical history.   SOCIAL HISTORY: Social history from the initial psychiatric evaluation has been reviewed (reviewed/updated 07/10/2014) with no additional updates (I asked patient and no additional social history provided).   History     Social History   ??? Marital Status: SINGLE     Spouse Name: N/A   ??? Number of Children: N/A   ??? Years of Education: N/A      Occupational History   ??? Not on file.     Social History Main Topics   ??? Smoking status: Current Every Day Smoker   ??? Smokeless tobacco: Not on file   ??? Alcohol Use: Yes   ??? Drug Use: Yes     Special: OTC, Marijuana   ??? Sexual Activity: Not on file     Other Topics Concern   ??? Not on file     Social History Narrative    34 year old single caucasian female voluntarily [facility] (where she had a medical admission following an intentional overdose of Tylenol and Exedrin). Pt. 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 After this happened she took the overdose and called a friend to bring hrt to the ED.  Pt. Regretted the OD. She has 3 sons ages 524,11,15, of whom she does not have custody. Pt. Chronically resides in hotels.      FAMILY HISTORY: Family history from the initial psychiatric evaluation has been reviewed (reviewed/updated 07/10/2014) with no additional updates (I asked patient and no additional family history provided). History reviewed. No pertinent family history.    REVIEW OF SYSTEMS: (reviewed/updated 07/10/2014)  Appetite:   Sleep: decreased more than normal   All other Review of Systems:      Psychological ROS: positive for - anxiety  Respiratory ROS: no cough, shortness of breath, or wheezing  Cardiovascular ROS: no chest pain or dyspnea on exertion         MENTAL STATUS EXAM & VITALS     MENTAL STATUS EXAM (MSE):    MSE FINDINGS ARE WITHIN NORMAL LIMITS (WNL) UNLESS OTHERWISE STATED BELOW.    Orientation oriented to time, place and person   Vital Signs (BP,Pulse, Temp) See below (reviewed 07/10/2014); vital signs are WNL if not listed below.   Gait and Station Stable/steady, no ataxia   Abnormal Muscular Movements, Tone, and Behavior No EPS, no Tardive Dyskinesia, no abnormal muscular movements; wnl tone   Relations cooperative   General Appearance:  age appropriate and overweight   Language No aphasia or dysarthria   Speech:  normal volume    Thought Processes logical, wnl rate of thoughts, good abstract reasoning and computation   Thought Associations normal   Thought Content not internally preoccupied    Suicidal Ideations none   Homicidal Ideations none   Mood:  hostile   Affect:  full range   Memory recent  adequate   Memory remote:  adequate   Concentration/Attention:  adequate   Fund of Knowledge average   Insight:  limited   Reliability poor   Judgment:  limited        VITALS:     Patient Vitals for the past 24 hrs:   Temp Pulse Resp BP SpO2   07/10/14 0830 98 ??F (36.7 ??C) 74 16 106/75  mmHg 98 %   07/10/14 0510 97.9 ??F (36.6 ??C) 89 16 110/67 mmHg 99 %   07/09/14 1927 97.9 ??F (36.6 ??C) 89 20 127/84 mmHg -   07/09/14 1730 97.5 ??F (36.4 ??C) 76 20 129/69 mmHg 97 %   07/09/14 1200 97.8 ??F (36.6 ??C) 89 20 (!) 86/56 mmHg -            DATA     LABORATORY DATA:(reviewed/updated 07/10/2014)  No results found for this or any previous visit (from the past 24 hour(s)).  No results found for: VALF2, VALAC, VALP, VALPR, DS6, CRBAM, CRBAMP, CARB2, XCRBAM  No results found for: LI, LIH, LITHM   RADIOLOGY REPORTS:(reviewed/updated 07/10/2014)  No results found.       MEDICATIONS     ALL MEDICATIONS:   Current Facility-Administered Medications   Medication Dose Route Frequency   ??? [START ON 07/11/2014] lithium carbonate CR (ESKALITH CR) tablet 900 mg  900 mg Oral DAILY   ??? methocarbamol (ROBAXIN) tablet 500 mg  500 mg Oral TID   ??? OLANZapine (ZyPREXA) tablet 10 mg  10 mg Oral QHS   ??? nicotine (NICORETTE) gum 4 mg  4 mg Oral Q2H PRN   ??? ziprasidone (GEODON) 20 mg in sterile water (preservative free) injection  20 mg IntraMUSCular BID PRN   ??? OLANZapine (ZyPREXA) tablet 5 mg  5 mg Oral Q6H PRN ??? benztropine (COGENTIN) tablet 2 mg  2 mg Oral BID PRN   ??? benztropine (COGENTIN) injection 2 mg  2 mg IntraMUSCular Q12H PRN   ??? LORazepam (ATIVAN) injection 2 mg  2 mg IntraMUSCular Q4H PRN   ??? LORazepam (ATIVAN) tablet 1 mg  1 mg Oral Q4H PRN    ??? acetaminophen (TYLENOL) tablet 650 mg  650 mg Oral Q4H PRN   ??? magnesium hydroxide (MILK OF MAGNESIA) oral suspension 30 mL  30 mL Oral DAILY PRN   ??? pantoprazole (PROTONIX) tablet 40 mg  40 mg Oral ACB   ??? sucralfate (CARAFATE) 100 mg/mL oral suspension 1 g  1,000 mg Oral TID PRN   ??? zolpidem (AMBIEN) tablet 5 mg  5 mg Oral QHS PRN      SCHEDULED MEDICATIONS:   Current Facility-Administered Medications   Medication Dose Route Frequency   ??? [START ON 07/11/2014] lithium carbonate CR (ESKALITH CR) tablet 900 mg  900 mg Oral DAILY   ??? methocarbamol (ROBAXIN) tablet 500 mg  500 mg Oral TID   ??? OLANZapine (ZyPREXA) tablet 10 mg  10 mg Oral QHS   ??? pantoprazole (PROTONIX) tablet 40 mg  40 mg Oral ACB            ASSESSMENT & PLAN     The patient, Sabrina Saunders, is a 34 y.o.  female who presents at this time for treatment of the following diagnoses: (reviewed/updated 07/10/2014)  Patient Active Hospital Problem List:   Bipolar 1 disorder (HCC) (07/06/2014)    Assessment:mania alternating with depression      Plan: lithium           In summary, Sabrina Saunders presents with a severe exacerbation of the principal diagnosis, Bipolar 1 disorder (HCC), which is worsening/not improving/not stable .    Patient requires continued inpatient hospitalization for further stabilization. I will continue to coordinate the provision of individual, milieu, occupational, group, and substance abuse therapies to address target symptoms/diagnoses as deemed appropriate for the individual patient.     I will continue to monitor blood levels (Depakote, Tegretol, lithium, clozapine---a  drug with a narrow therapeutic index= NTI) and associated labs for drug therapy implemented that require intense monitoring for toxicity as deemed appropriate base on current medication side effects and pharmacodynamically determined drug 1/2 lives.       A coordinated, multidisplinary treatment team round was conducted with the  patient---this is done daily here at [facility] . This team consists of the nurse, psychiatric unit pharmcist, social worker and Clinical research associate.     The following regarding medications was addressed during rounds with patient:   the risks and benefits of the proposed medication. The patient was given the opportunity to ask questions. Informed consent given to the use of the above medications. Will continue to adjust psychiatric and non-psychiatric medications (see above "medication" section and orders section for details) as deemed appropriate & based upon diagnoses and response to treatment.     I will continue to order blood tests/labs and diagnostic tests as deemed appropriate and review results as they become available (see orders for details and above listed lab/test results).    I will order psychiatric records from previous psych hospitals to further elucidate the nature of patient's psychopathology and review once available.    I will gather additional collateral information from friends, family and o/p treatment team to further elucidate the nature of patient's psychopathology and baselline level of psychiatric functioning.    Complete current electronic health record for patient has been reviewed today including consultant notes, ancillary staff notes, nurses and psychiatric tech notes    I certify that this patient's inpatient psychiatric hospital services furnished since the previous certification were, and continue to be, required for treatment that could reasonably be expected to improve the patient's condition, or for diagnostic study, and that the patient continues to need, on a daily basis, active treatment furnished directly by or requiring the supervision of inpatient psychiatric facility personnel. In addition, the hospital records show that services furnished were intensive treatment services, admission or related services, or equivalent services.    EXPECTED DISCHARGE DATE/DAY: TBD      DISPOSITION: Home       Signed By:   Cheryll Cockayne, MD  07/10/2014

## 2014-07-10 NOTE — Behavioral Health Treatment Team (Signed)
Received patient in the main lobby area watching television. Patient voices no concern at this time. NAD. Q-15 checks for safety.

## 2014-07-10 NOTE — Behavioral Health Treatment Team (Addendum)
1720  Upon initial rounds patient received resting quietly in bed appeared to be sleeping.  Now up and visible on unit.  Ate dinner with peers.  Medication compliant.  Scheduled Robaxin 500 mg given for mid back pain.  Patient requests meeting with case worker 1:1 r/t discharge plans.  Patient notified case worker will be informed.  Patient appears irritable for having to wait until tomorrow.  Writer assures patient information will be passed on and patient should also request  to see case worker in morning.    2114  Patient reports Robaxin is working well for back pain.  "Pain comes and goes"    2203  PRN ambien 5 mg given to aid with sleep per patient request

## 2014-07-10 NOTE — Progress Notes (Signed)
Laboratory Monitoring for Atypical Antipsychotics and Mood Stabilizers    This patient is currently prescribed the following medication(s):   Current Facility-Administered Medications   Medication Dose Route Frequency   ??? lithium carbonate CR (ESKALITH CR) tablet 900 mg  900 mg Oral DAILY   ??? methocarbamol (ROBAXIN) tablet 500 mg  500 mg Oral TID   ??? OLANZapine (ZyPREXA) tablet 10 mg  10 mg Oral QHS   ??? pantoprazole (PROTONIX) tablet 40 mg  40 mg Oral ACB       The following labs have been completed for monitoring of atypical antipsychotics and/or mood stabilizers:    Height, Weight, BMI Estimation  Estimated body mass index is 40.83 kg/(m^2) as calculated from the following:    Height as of 07/05/14: 162.6 cm (64").    Weight as of this encounter: 107.956 kg (238 lb).     Vital Signs/Blood Pressure  BP 106/75 mmHg   Pulse 74   Temp(Src) 98 ??F (36.7 ??C)   Resp 16   Wt 107.956 kg (238 lb)   SpO2 98%   LMP 07/06/2014   Breastfeeding? No    Renal Function, Hepatic Function and Chemistry  Estimated Creatinine Clearance: 146.3 mL/min (based on Cr of 0.65).    Lab Results   Component Value Date/Time    SODIUM 139 07/10/2014 09:51 AM    POTASSIUM 3.9 07/10/2014 09:51 AM    CHLORIDE 103 07/10/2014 09:51 AM    CO2 28 07/10/2014 09:51 AM    ANION GAP 8 07/10/2014 09:51 AM    BUN 10 07/10/2014 09:51 AM    CREATININE 0.65 07/10/2014 09:51 AM    BUN/CREATININE RATIO 15 07/10/2014 09:51 AM    BILIRUBIN, TOTAL 0.2 07/06/2014 12:01 AM    PROTEIN, TOTAL 6.6 07/06/2014 12:01 AM    ALBUMIN 2.9 07/06/2014 12:01 AM    GLOBULIN 3.7 07/06/2014 12:01 AM    A-G RATIO 0.8 07/06/2014 12:01 AM    ALT 21 07/06/2014 12:01 AM    ALK. PHOSPHATASE 72 07/06/2014 12:01 AM       Lab Results   Component Value Date/Time    GLUCOSE 100 07/10/2014 09:51 AM    GLUCOSE 100 07/07/2014 04:57 AM       No results found for: HBA1C, HGBE8, HBA1CEXT    Hematology  Lab Results   Component Value Date/Time    WBC 6.0 07/06/2014 05:32 AM     RBC 4.09 07/06/2014 05:32 AM    HGB 10.6 07/06/2014 05:32 AM    HCT 32.7 07/06/2014 05:32 AM    MCV 80.0 07/06/2014 05:32 AM    MCH 25.9 07/06/2014 05:32 AM    MCHC 32.4 07/06/2014 05:32 AM    RDW 13.8 07/06/2014 05:32 AM    PLATELET 145 07/06/2014 05:32 AM       Lipids  Lab Results   Component Value Date/Time    CHOLESTEROL, TOTAL 102 07/07/2014 04:57 AM    HDL CHOLESTEROL 32 07/07/2014 04:57 AM    LDL, CALCULATED 47.6 07/07/2014 04:57 AM    TRIGLYCERIDE 112 07/07/2014 04:57 AM    CHOL/HDL RATIO 3.2 07/07/2014 04:57 AM       Thyroid Function  Lab Results   Component Value Date/Time    TSH 0.79 07/05/2014 08:40 AM       Pregnancy Status  Lab Results   Component Value Date/Time    PREGNANCY TEST,URINE (POC) NEGATIVE  07/05/2014 03:49 AM       Assessment/Plan:  Recommended baseline laboratory monitoring has been completed  based on this patient's current medication regimen.       Elberta Spaniel, PharmD, [place], [place]

## 2014-07-11 MED ORDER — OLANZAPINE 10 MG TAB
10 mg | ORAL_TABLET | Freq: Every evening | ORAL | Status: AC
Start: 2014-07-11 — End: ?

## 2014-07-11 MED ORDER — METHOCARBAMOL 500 MG TAB
500 mg | ORAL_TABLET | Freq: Three times a day (TID) | ORAL | Status: AC
Start: 2014-07-11 — End: ?

## 2014-07-11 MED ORDER — LITHIUM CARBONATE 600 MG CAP
600 mg | ORAL_CAPSULE | Freq: Every day | ORAL | Status: AC
Start: 2014-07-11 — End: ?

## 2014-07-11 MED FILL — ZOLPIDEM 5 MG TAB: 5 mg | ORAL | Qty: 1

## 2014-07-11 MED FILL — OLANZAPINE 5 MG TAB: 5 mg | ORAL | Qty: 2

## 2014-07-11 MED FILL — PANTOPRAZOLE 40 MG TAB, DELAYED RELEASE: 40 mg | ORAL | Qty: 1

## 2014-07-11 MED FILL — METHOCARBAMOL 500 MG TAB: 500 mg | ORAL | Qty: 1

## 2014-07-11 MED FILL — LORAZEPAM 1 MG TAB: 1 mg | ORAL | Qty: 1

## 2014-07-11 MED FILL — LITHIUM CARBONATE 300 MG CAP: 300 mg | ORAL | Qty: 4

## 2014-07-11 NOTE — Behavioral Health Treatment Team (Signed)
Has appeared to sleep 5 1/2 hour + , NAD noted .  Monitored Q15 min for safety

## 2014-07-11 NOTE — Discharge Summary (Signed)
PSYCHIATRIC DISCHARGE SUMMARY         IDENTIFICATION:    Patient Name  Sabrina Saunders   Date of Birth October 14, 1980   CSN [id number]   Medical Record Number  [id number]      Age  34 y.o.   PCP None   Admit date:  07/06/2014    Discharge date: 07/11/2014   Room Number  730/02  @ [facility]   Date of Service  07/11/2014               TYPE OF DISCHARGE: REGULAR               CONDITION AT DISCHARGE: good and improved       PROVISIONAL & DISCHARGE DIAGNOSES:    Problem List  Date Reviewed: 07/24/2014          Codes Class    Hypokalemia ICD-10-CM: E87.6  ICD-9-CM: 276.8         * (Principal)Bipolar 1 disorder ([place]) ICD-10-CM: F31.9  ICD-9-CM: 296.7         Suicide attempt by multiple drug overdose ([place]) ICD-10-CM: T50.902A  ICD-9-CM: 977.9, E950.5         Bipolar disorder with depression ([place]) ICD-10-CM: F31.30  ICD-9-CM: 296.50 Chronic        Insomnia ICD-10-CM: G47.00  ICD-9-CM: 780.52 Chronic        Cannabis abuse ICD-10-CM: F12.10  ICD-9-CM: 305.20 Chronic        Tobacco use disorder, mild, abuse ICD-10-CM: Z72.0  ICD-9-CM: 305.1 Chronic        Obesity (BMI 30-39.9) ICD-10-CM: E66.9  ICD-9-CM: 278.00 Chronic    Overview Signed 07/05/2014  6:33 AM by Babette Relic, MD     Body mass index is 39.83 kg/(m^2).                    Active Hospital Problems    Hypokalemia      *Bipolar 1 disorder ([place])      Suicide attempt by multiple drug overdose ([place])      Insomnia      Obesity (BMI 30-39.9)      Tobacco use disorder, mild, abuse      Cannabis abuse        DISCHARGE DIAGNOSIS:   Axis I:  SEE ABOVE  Axis II: SEE ABOVE  Axis III: SEE ABOVE  Axis IV:  lack of structure  Axis V:  60 on admission, 70 on discharge 70(baseline)       CC & HISTORY OF PRESENT ILLNESS:  74 year olod single caucasian female admitted voluntarily for mania, in the context of medication non compliance. Pt was restarted on Lithium and Zyprexa, with symptom resolution. At discharge she denied SI/HI intent and plan.     SOCIAL HISTORY:    History      Social History   ??? Marital Status: SINGLE     Spouse Name: N/A   ??? Number of Children: N/A   ??? Years of Education: N/A     Occupational History   ??? Not on file.     Social History Main Topics   ??? Smoking status: Current Every Day Smoker   ??? Smokeless tobacco: Not on file   ??? Alcohol Use: Yes   ??? Drug Use: Yes     Special: OTC, Marijuana   ??? Sexual Activity: Not on file     Other Topics Concern   ??? Not on file     Social History Narrative  34 year old single caucasian female voluntarily admitted from [facility] (where she had a medical admission following an intentional overdose of Tylenol and Exedrin). Pt. Reportedly lost her job in [employer] to [employer] yesterday. After this happened she took the overdose and called a friend to bring hrt to the ED.  Pt. Regretted the OD. She has 3 sons ages 56,11,15, of whom she does not have custody. Pt. Chronically [place] hotels.      FAMILY HISTORY:   History reviewed. No pertinent family history.          HOSPITALIZATION COURSE:    Sabrina Saunders was admitted to the inpatient psychiatric unit [facility] for acute psychiatric stabilization in regards to symptomatology as described in the HPI above. The differential diagnosis at time of admission included: bipolar dis vs. schizoaffective vs. Schizophrenia.  While on the unit Sabrina Saunders was involved in individual, group, occupational and milieu therapy.  Psychiatric medications were adjusted during this hospitalization including lithium.   Sabrina Saunders demonstrated a slow, but progressive improvement in overall condition.  Much of patient's depression appeared to be related to situational stressors and psychological factors.  Please see individual progress notes for more specific details regarding patient's hospitalization course.   At time of dc, Sabrina Saunders was without significant problems with depression psychosis  mania.  Overall presentation at time of discharge is  most consistent with the diagnosis of bipolar disorder Patient with request for discharge today. There are no grounds to seek a TDO. Patient has maximized benefit to be derived from acute inpatient psychiatric treatment.  All members of the treatment team concur with each other in regards to plans for discharge today per patient's request.          LABS AND IMAGAING:    Labs Reviewed   METABOLIC PANEL, BASIC - Abnormal; Notable for the following:     Chloride 109 (*)     BUN/Creatinine ratio 23 (*)     Calcium 7.6 (*)   All other components within normal limits   LITHIUM - Abnormal; Notable for the following:     Lithium 0.43 (*)     All other components within normal limits   GLUCOSE, FASTING   LIPID PANEL   MAGNESIUM   METABOLIC PANEL, BASIC     Admission on 07/06/2014   Component Date Value Ref Range Status   ??? Glucose 07/07/2014 100  65 - 100 MG/DL Final   ??? LIPID PROFILE 07/07/2014       Final   ??? Cholesterol, total 07/07/2014 102  <200 MG/DL Final   ??? Triglyceride 07/07/2014 112  <150 MG/DL Final   ??? HDL Cholesterol 07/07/2014 32   Final   ??? LDL, calculated 07/07/2014 47.6  0 - 100 MG/DL Final   ??? VLDL, calculated 07/07/2014 22.4   Final   ??? CHOL/HDL Ratio 07/07/2014 3.2  0 - 5.0   Final   ??? Sodium 07/07/2014 141  136 - 145 mmol/L Final   ??? Potassium 07/07/2014 3.8  3.5 - 5.1 mmol/L Final   ??? Chloride 07/07/2014 109* 97 - 108 mmol/L Final   ??? CO2 07/07/2014 24  21 - 32 mmol/L Final   ??? Anion gap 07/07/2014 8  5 - 15 mmol/L Final   ??? Glucose 07/07/2014 98  65 - 100 mg/dL Final   ??? BUN 07/07/2014 15  6 - 20 MG/DL Final   ??? Creatinine 07/07/2014 0.64  0.55 - 1.02 MG/DL Final   ???  BUN/Creatinine ratio 07/07/2014 23* 12 - 20   Final   ??? GFR est AA 07/07/2014 >60  >60 ml/min/1.35m Final   ??? GFR est non-AA 07/07/2014 >60  >60 ml/min/1.747mFinal   ??? Calcium 07/07/2014 7.6* 8.5 - 10.1 MG/DL Final   ??? Magnesium 07/07/2014 1.7  1.6 - 2.4 mg/dL Final   ??? Lithium 07/10/2014 0.43* 0.60 - 1.20 MMOL/L Final    ??? Reported dose date: 07/10/2014 NOT PROVIDED   Final   ??? Reported dose time: 07/10/2014 NOT PROVIDED   Final   ??? Reported dose: 07/10/2014 NOT PROVIDED   Final   ??? Sodium 07/10/2014 139  136 - 145 mmol/L Final   ??? Potassium 07/10/2014 3.9  3.5 - 5.1 mmol/L Final   ??? Chloride 07/10/2014 103  97 - 108 mmol/L Final   ??? CO2 07/10/2014 28  21 - 32 mmol/L Final   ??? Anion gap 07/10/2014 8  5 - 15 mmol/L Final   ??? Glucose 07/10/2014 100  65 - 100 mg/dL Final   ??? BUN 07/10/2014 10  6 - 20 MG/DL Final   ??? Creatinine 07/10/2014 0.65  0.55 - 1.02 MG/DL Final   ??? BUN/Creatinine ratio 07/10/2014 15  12 - 20   Final   ??? GFR est AA 07/10/2014 >60  >60 ml/min/1.7342minal   ??? GFR est non-AA 07/10/2014 >60  >60 ml/min/1.63m21mnal   ??? Calcium 07/10/2014 8.7  8.5 - 10.1 MG/DL Final   Admission on 07/05/2014, Discharged on 07/06/2014   Component Date Value Ref Range Status   ??? Ventricular Rate 07/05/2014 87   Corrected   ??? Atrial Rate 07/05/2014 87   Corrected   ??? P-R Interval 07/05/2014 142   Corrected   ??? QRS Duration 07/05/2014 78   Corrected   ??? Q-T Interval 07/05/2014 394   Corrected   ??? QTC Calculation ([place]) 07/05/2014 474   Corrected   ??? Calculated P Axis 07/05/2014 71   Corrected   ??? Calculated R Axis 07/05/2014 54   Corrected   ??? Calculated T Axis 07/05/2014 48   Corrected   ??? Diagnosis 07/05/2014    Corrected                    Value:Normal sinus rhythm  Right atrial enlargement  No previous ECGs available  Confirmed by BallBonney [phone] 07/05/2014 7:43:52 AM  Also confirmed by HageComer [id number] 07/05/2014 7:46:30 AM     ??? WBC 07/05/2014 9.5  3.6 - 11.0 K/uL Final   ??? RBC 07/05/2014 4.73  3.80 - 5.20 M/uL Final   ??? HGB 07/05/2014 12.2  11.5 - 16.0 g/dL Final   ??? HCT 07/05/2014 37.9  35.0 - 47.0 % Final   ??? MCV 07/05/2014 80.1  80.0 - 99.0 FL Final   ??? MCH 07/05/2014 25.8* 26.0 - 34.0 PG Final   ??? MCHC 07/05/2014 32.2  30.0 - 36.5 g/dL Final   ??? RDW 07/05/2014 13.7  11.5 - 14.5 % Final    ??? PLATELET 07/05/2014 179  150 - 400 K/uL Final   ??? NEUTROPHILS 07/05/2014 73  32 - 75 % Final   ??? LYMPHOCYTES 07/05/2014 21  12 - 49 % Final   ??? MONOCYTES 07/05/2014 5  5 - 13 % Final   ??? EOSINOPHILS 07/05/2014 1  0 - 7 % Final   ??? BASOPHILS 07/05/2014 0  0 - 1 % Final   ??? ABS. NEUTROPHILS 07/05/2014 7.0  1.8 - 8.0 K/UL Final   ??? ABS. LYMPHOCYTES 07/05/2014 2.0  0.8 - 3.5 K/UL Final   ??? ABS. MONOCYTES 07/05/2014 0.5  0.0 - 1.0 K/UL Final   ??? ABS. EOSINOPHILS 07/05/2014 0.1  0.0 - 0.4 K/UL Final   ??? ABS. BASOPHILS 07/05/2014 0.0  0.0 - 0.1 K/UL Final   ??? CK 07/05/2014 77  26 - 192 U/L Final   ??? CK - MB 07/05/2014 <0.5* 0.5 - 3.6 NG/ML Final   ??? CK-MB Index 07/05/2014 CANNOT BE CALCULATED  0 - 2.5   Final   ??? Sodium 07/05/2014 138  136 - 145 mmol/L Final   ??? Potassium 07/05/2014 3.4* 3.5 - 5.1 mmol/L Final   ??? Chloride 07/05/2014 105  97 - 108 mmol/L Final   ??? CO2 07/05/2014 22  21 - 32 mmol/L Final   ??? Anion gap 07/05/2014 11  5 - 15 mmol/L Final   ??? Glucose 07/05/2014 186* 65 - 100 mg/dL Final   ??? BUN 07/05/2014 12  6 - 20 MG/DL Final   ??? Creatinine 07/05/2014 0.78  0.55 - 1.02 MG/DL Final   ??? BUN/Creatinine ratio 07/05/2014 15  12 - 20   Final   ??? GFR est AA 07/05/2014 >60  >60 ml/min/1.69m Final   ??? GFR est non-AA 07/05/2014 >60  >60 ml/min/1.763mFinal   ??? Calcium 07/05/2014 8.4* 8.5 - 10.1 MG/DL Final   ??? Bilirubin, total 07/05/2014 0.4  0.2 - 1.0 MG/DL Final   ??? ALT 07/05/2014 19  12 - 78 U/L Final   ??? AST 07/05/2014 12* 15 - 37 U/L Final   ??? Alk. phosphatase 07/05/2014 97  45 - 117 U/L Final   ??? Protein, total 07/05/2014 7.8  6.4 - 8.2 g/dL Final   ??? Albumin 07/05/2014 3.7  3.5 - 5.0 g/dL Final   ??? Globulin 07/05/2014 4.1* 2.0 - 4.0 g/dL Final   ??? A-G Ratio 07/05/2014 0.9* 1.1 - 2.2   Final   ??? Magnesium 07/05/2014 1.5* 1.6 - 2.4 mg/dL Final   ??? INR 07/05/2014 1.1  0.9 - 1.1   Final   ??? Prothrombin time 07/05/2014 10.7  9.0 - 11.1 sec Final   ??? Troponin-I, Qt. 07/05/2014 <0.04  <0.05 ng/mL Final    ??? Color 07/05/2014 YELLOW/STRAW   Final   ??? Appearance 07/05/2014 CLEAR  CLEAR   Final   ??? Specific gravity 07/05/2014 >1.030* 1.003 - 1.030 Final   ??? pH (UA) 07/05/2014 5.0  5.0 - 8.0   Final   ??? Protein 07/05/2014 TRACE* NEG mg/dL Final ??? Glucose 07/05/2014 100* NEG mg/dL Final   ??? Ketone 07/05/2014 15* NEG mg/dL Final   ??? Bilirubin 07/05/2014 NEGATIVE   NEG   Final   ??? Blood 07/05/2014 NEGATIVE   NEG   Final   ??? Urobilinogen 07/05/2014 0.2  0.2 - 1.0 EU/dL Final   ??? Nitrites 07/05/2014 NEGATIVE   NEG   Final   ??? Leukocyte Esterase 07/05/2014 NEGATIVE   NEG   Final   ??? WBC 07/05/2014 0-4  0 - 4 /hpf Final   ??? RBC 07/05/2014 0-5  0 - 5 /hpf Final   ??? Epithelial cells 07/05/2014 FEW  FEW /lpf Final   ??? Bacteria 07/05/2014 NEGATIVE   NEG /hpf Final   ??? Mucus 07/05/2014 TRACE* NEG /lpf Final   ??? ACETAMINOPHEN 07/05/2014 227* 10 - 30 ug/mL Final   ??? ALCOHOL(ETHYL),SERUM 07/05/2014 <10  <10 MG/DL Final   ??? SALICYLATE  07/05/2014 1.8* 2.8 - 20.0 MG/DL Final   ??? AMPHETAMINE 07/05/2014 NEGATIVE   NEG   Final   ??? BARBITURATES 07/05/2014 NEGATIVE   NEG   Final   ??? BENZODIAZEPINE 07/05/2014 NEGATIVE   NEG   Final   ??? COCAINE 07/05/2014 NEGATIVE   NEG   Final   ??? METHADONE 07/05/2014 NEGATIVE   NEG   Final   ??? OPIATES 07/05/2014 NEGATIVE   NEG   Final   ??? PCP(PHENCYCLIDINE) 07/05/2014 NEGATIVE   NEG   Final   ??? THC (TH-CANNABINOL) 07/05/2014 POSITIVE* NEG   Final   ??? Drug screen comment 07/05/2014 (NOTE)   Final   ??? Pregnancy test,urine (POC) 07/05/2014 NEGATIVE   NEG   Final   ??? TSH 07/05/2014 0.79  0.36 - 3.74 uIU/mL Final   ??? Sed rate, automated 07/05/2014 29* 0 - 20 mm/hr Final   ??? ACETAMINOPHEN 07/05/2014 54* 10 - 30 ug/mL Final   ??? ACETAMINOPHEN 07/06/2014 <2* 10 - 30 ug/mL Final   ??? Sodium 07/06/2014 142  136 - 145 mmol/L Final   ??? Potassium 07/06/2014 3.1* 3.5 - 5.1 mmol/L Final   ??? Chloride 07/06/2014 109* 97 - 108 mmol/L Final   ??? CO2 07/06/2014 24  21 - 32 mmol/L Final    ??? Anion gap 07/06/2014 9  5 - 15 mmol/L Final   ??? Glucose 07/06/2014 148* 65 - 100 mg/dL Final   ??? BUN 07/06/2014 6  6 - 20 MG/DL Final   ??? Creatinine 07/06/2014 0.57  0.55 - 1.02 MG/DL Final   ??? BUN/Creatinine ratio 07/06/2014 11* 12 - 20   Final   ??? GFR est AA 07/06/2014 >60  >60 ml/min/1.45m Final   ??? GFR est non-AA 07/06/2014 >60  >60 ml/min/1.750mFinal   ??? Calcium 07/06/2014 7.9* 8.5 - 10.1 MG/DL Final   ??? WBC 07/06/2014 6.0  3.6 - 11.0 K/uL Final ??? RBC 07/06/2014 4.09  3.80 - 5.20 M/uL Final   ??? HGB 07/06/2014 10.6* 11.5 - 16.0 g/dL Final   ??? HCT 07/06/2014 32.7* 35.0 - 47.0 % Final   ??? MCV 07/06/2014 80.0  80.0 - 99.0 FL Final   ??? MCH 07/06/2014 25.9* 26.0 - 34.0 PG Final   ??? MCHC 07/06/2014 32.4  30.0 - 36.5 g/dL Final   ??? RDW 07/06/2014 13.8  11.5 - 14.5 % Final   ??? PLATELET 07/06/2014 145* 150 - 400 K/uL Final   ??? NEUTROPHILS 07/06/2014 48  32 - 75 % Final   ??? LYMPHOCYTES 07/06/2014 44  12 - 49 % Final   ??? MONOCYTES 07/06/2014 5  5 - 13 % Final   ??? EOSINOPHILS 07/06/2014 2  0 - 7 % Final   ??? BASOPHILS 07/06/2014 1  0 - 1 % Final   ??? ABS. NEUTROPHILS 07/06/2014 2.9  1.8 - 8.0 K/UL Final   ??? ABS. LYMPHOCYTES 07/06/2014 2.6  0.8 - 3.5 K/UL Final   ??? ABS. MONOCYTES 07/06/2014 0.3  0.0 - 1.0 K/UL Final   ??? ABS. EOSINOPHILS 07/06/2014 0.1  0.0 - 0.4 K/UL Final   ??? ABS. BASOPHILS 07/06/2014 0.0  0.0 - 0.1 K/UL Final   ??? ACETAMINOPHEN 07/06/2014 2* 10 - 30 ug/mL Final   ??? Protein, total 07/06/2014 6.6  6.4 - 8.2 g/dL Final   ??? Albumin 07/06/2014 2.9* 3.5 - 5.0 g/dL Final   ??? Globulin 07/06/2014 3.7  2.0 - 4.0 g/dL Final   ??? A-G Ratio 07/06/2014 0.8* 1.1 -  2.2   Final   ??? Bilirubin, total 07/06/2014 0.2  0.2 - 1.0 MG/DL Final   ??? Bilirubin, direct 07/06/2014 <0.1  0.0 - 0.2 MG/DL Final   ??? Alk. phosphatase 07/06/2014 72  45 - 117 U/L Final   ??? AST 07/06/2014 9* 15 - 37 U/L Final   ??? ALT 07/06/2014 21  12 - 78 U/L Final     No results found.                DISPOSITION:     Home. Patient to f/u with o/p psychiatric, and psychotherapy appointments. Patient is to f/u with internist as directed.  Patient should have a lithium level and associated labs checked within the next 1-2 weeks by patient's o/p psychiatrist/internist.               FOLLOW-UP CARE:    Activity as tolerated  Regular Diet  Wound Care: none needed.  Follow-up Information     Follow up With Details Comments Contact Info    None   None ([phone]) Patient stated that they have no PCP                   PROGNOSIS:  Greatly dependent upon patient's ability to remain sober and to f/u with o/p drug/etoh rehabilitation and psychiatric/psychotherapy appointments as well as to comply with psychiatric medications as prescribed. Patient denies suicidal or homicidal ideations. Sabrina Saunders fully contracts for safety. Patient reports many positive predictive factors in terms of not attempting suicide or homicide. Patient appears to be at low risk of suicide or homicide. Patient and family are aware and in agreement with discharge and discharge plan.            DISCHARGE MEDICATIONS: (no changes made).    Informed consent given for the use of following psychotropic medications:  Current Discharge Medication List      START taking these medications    Details   lithium carbonate 600 mg capsule Take 2 Caps by mouth daily. Indications: BIPOLAR DISORDER  Qty: 120 Cap, Refills: 0      methocarbamol (ROBAXIN) 500 mg tablet Take 1 Tab by mouth three (3) times daily. Indications: MUSCLE SPASM  Qty: 90 Tab, Refills: 0      OLANZapine (ZYPREXA) 10 mg tablet Take 1 Tab by mouth nightly. Indications: BIPOLAR DISORDER IN REMISSION  Qty: 30 Tab, Refills: 0         CONTINUE these medications which have NOT CHANGED    Details   pantoprazole (PROTONIX) 40 mg tablet Take 40 mg by mouth daily. Indications: HEARTBURN      nicotine (NICODERM CQ) 7 mg/24 hr 1 Patch by TransDERmal route daily as needed (Tobacco crave) for up to 30 days.   Qty: 30 Patch, Refills: 0      sucralfate (CARAFATE) 100 mg/mL suspension Take 10 mL by mouth three (3) times daily as needed (indigestion).  Qty: 414 mL, Refills: 0                    A coordinated, multidisplinary treatment team round was conducted with Kytzia Stucky---this is done daily here at [facility] . This team consists of the nurse, psychiatric unit pharmcist, Catering manager.     I have spent greater than 35 minutes on discharge work.    Signed:  Isac Caddy, MD  07/11/2014

## 2014-07-11 NOTE — Progress Notes (Addendum)
Problem: Depressed Mood (Adult/Pediatric)  Goal: *STG: Participates in treatment plan  Outcome: Progressing Towards Goal  Pt talkative, visible on unit. Med/meal compliant. Some tangential thought, Listing all of medical certifications she has when at med window. Denies SI      Pt discharged with prescriptions, belongings, discharge instructions. Manager picked her up

## 2014-07-11 NOTE — Behavioral Health Treatment Team (Cosign Needed)
GROUP THERAPY PROGRESS NOTE    [place] [facility]     Group time: 15 minutes    Personal goal for participation: not try to rush myself and make a couple of sales, when I get out.    Goal orientation: personal    Group therapy participation: active    Therapeutic interventions reviewed and discussed: Writer listened attentively and explained unit rules.    Impression of participation: Patient participated actively.

## 2014-07-11 NOTE — Other (Addendum)
Behavioral Health Interdisciplinary Rounds     Patient Name:  Sabrina Saunders Benzel  Age:  34 y.o.  Room/Bed:  730/02  Primary Diagnosis:  Bipolar 1 disorder (HCC)   Admission Status:  Voluntary     Readmission within 30 days:  no  Power of Attorney in Place:  no    VTE Prophylaxis: Not indicated  Influenza Vaccine Administered and/or Documentation completed?:  yes  Pneumococcal Vaccine Administered and/or Documentation completed?:  yes  Mobility Needs:  no    Nutritional Plan:  no  Consults:  no        Labs/Testing due today?:  .43 yesterday, Lithium was increased    Sleep hours: 5.5        Participation in Care/Groups:  yes  Medication Compliant?:  Yes  PRNS (last 24 hours): Antianxiety, Sleep Aid and Pain    Restraints (last 24 hours):  no  Substance Abuse:  no  CIWA (range last 24 hours):  COWS (range last 24 hours):     Treatment team focus: Plan for Discharge today.    Patient goal(s) for today: Patient is asking to go home.  LOS:  5  Expected LOS:   Financial Concerns:  yes  Date of last family contact:  No family contact      Family Requesting Physician Contact today:  no  Discharge plan: refer to [facility]        Outpatient provider(s): [facility]     Treatment team members present for discussion: Lanae Crumblyina Bishop, SW - Dr. Mabeline CarasHaine - Lorin GlassKatie Adams, PharmD- Nicholes Rougharolyn Farmer, RN and Sabrina Saunders Skipper

## 2015-05-26 ENCOUNTER — Emergency Department (HOSPITAL_COMMUNITY): Payer: Self-pay

## 2015-05-26 ENCOUNTER — Emergency Department (HOSPITAL_COMMUNITY)
Admission: EM | Admit: 2015-05-26 | Discharge: 2015-05-26 | Disposition: A | Discharge status: Home / Self Care | Payer: Self-pay | Attending: Physician Assistant | Admitting: Physician Assistant

## 2015-05-26 ENCOUNTER — Encounter (HOSPITAL_COMMUNITY): Payer: Self-pay | Admitting: *Deleted

## 2015-05-26 DIAGNOSIS — Z8719 Personal history of other diseases of the digestive system: Secondary | ICD-10-CM | POA: Insufficient documentation

## 2015-05-26 DIAGNOSIS — Z8659 Personal history of other mental and behavioral disorders: Secondary | ICD-10-CM | POA: Insufficient documentation

## 2015-05-26 DIAGNOSIS — N730 Acute parametritis and pelvic cellulitis: Secondary | ICD-10-CM | POA: Insufficient documentation

## 2015-05-26 DIAGNOSIS — Z202 Contact with and (suspected) exposure to infections with a predominantly sexual mode of transmission: Secondary | ICD-10-CM | POA: Insufficient documentation

## 2015-05-26 DIAGNOSIS — R52 Pain, unspecified: Secondary | ICD-10-CM

## 2015-05-26 DIAGNOSIS — R21 Rash and other nonspecific skin eruption: Secondary | ICD-10-CM | POA: Insufficient documentation

## 2015-05-26 DIAGNOSIS — J209 Acute bronchitis, unspecified: Secondary | ICD-10-CM | POA: Insufficient documentation

## 2015-05-26 DIAGNOSIS — F17210 Nicotine dependence, cigarettes, uncomplicated: Secondary | ICD-10-CM | POA: Insufficient documentation

## 2015-05-26 DIAGNOSIS — Z79899 Other long term (current) drug therapy: Secondary | ICD-10-CM | POA: Insufficient documentation

## 2015-05-26 DIAGNOSIS — E669 Obesity, unspecified: Secondary | ICD-10-CM | POA: Insufficient documentation

## 2015-05-26 DIAGNOSIS — Z3202 Encounter for pregnancy test, result negative: Secondary | ICD-10-CM | POA: Insufficient documentation

## 2015-05-26 HISTORY — DX: Diaphragmatic hernia without obstruction or gangrene: K44.9

## 2015-05-26 HISTORY — DX: Bipolar disorder, unspecified: F31.9

## 2015-05-26 HISTORY — DX: Obesity, unspecified: E66.9

## 2015-05-26 LAB — CBC WITH DIFFERENTIAL/PLATELET
BASOS ABS: 0 10*3/uL (ref 0.0–0.1)
BASOS PCT: 0 %
Eosinophils Absolute: 0.2 10*3/uL (ref 0.0–0.7)
Eosinophils Relative: 3 %
HEMATOCRIT: 40.1 % (ref 36.0–46.0)
Hemoglobin: 13 g/dL (ref 12.0–15.0)
Lymphocytes Relative: 41 %
Lymphs Abs: 2.9 10*3/uL (ref 0.7–4.0)
MCH: 25.4 pg — ABNORMAL LOW (ref 26.0–34.0)
MCHC: 32.4 g/dL (ref 30.0–36.0)
MCV: 78.5 fL (ref 78.0–100.0)
MONO ABS: 0.4 10*3/uL (ref 0.1–1.0)
Monocytes Relative: 5 %
NEUTROS ABS: 3.5 10*3/uL (ref 1.7–7.7)
Neutrophils Relative %: 50 %
PLATELETS: 187 10*3/uL (ref 150–400)
RBC: 5.11 MIL/uL (ref 3.87–5.11)
RDW: 14.1 % (ref 11.5–15.5)
WBC: 6.9 10*3/uL (ref 4.0–10.5)

## 2015-05-26 LAB — COMPREHENSIVE METABOLIC PANEL
ALBUMIN: 3.7 g/dL (ref 3.5–5.0)
ALT: 17 U/L (ref 14–54)
AST: 21 U/L (ref 15–41)
Alkaline Phosphatase: 93 U/L (ref 38–126)
Anion gap: 6 (ref 5–15)
BILIRUBIN TOTAL: 0.3 mg/dL (ref 0.3–1.2)
BUN: 12 mg/dL (ref 6–20)
CHLORIDE: 106 mmol/L (ref 101–111)
CO2: 26 mmol/L (ref 22–32)
Calcium: 9 mg/dL (ref 8.9–10.3)
Creatinine, Ser: 0.58 mg/dL (ref 0.44–1.00)
GFR calc Af Amer: >60 mL/min (ref >60)
GFR calc non Af Amer: >60 mL/min (ref >60)
GLUCOSE: 83 mg/dL (ref 65–99)
POTASSIUM: 4 mmol/L (ref 3.5–5.1)
Sodium: 138 mmol/L (ref 135–145)
Total Protein: 7.8 g/dL (ref 6.5–8.1)

## 2015-05-26 LAB — WET PREP, GENITAL
Clue Cells Wet Prep HPF POC: NONE SEEN
SPERM: NONE SEEN
TRICH WET PREP: NONE SEEN
WBC, Wet Prep HPF POC: NONE SEEN
Yeast Wet Prep HPF POC: NONE SEEN

## 2015-05-26 LAB — URINALYSIS, ROUTINE W REFLEX MICROSCOPIC
BILIRUBIN URINE: NEGATIVE
GLUCOSE, UA: NEGATIVE mg/dL
HGB URINE DIPSTICK: NEGATIVE
KETONES UR: NEGATIVE mg/dL
NITRITE: NEGATIVE
PH: 7 (ref 5.0–8.0)
Protein, ur: NEGATIVE mg/dL
SPECIFIC GRAVITY, URINE: 1.024 (ref 1.005–1.030)

## 2015-05-26 LAB — RAPID URINE DRUG SCREEN, HOSP PERFORMED
AMPHETAMINES: NOT DETECTED
Barbiturates: NOT DETECTED
Benzodiazepines: NOT DETECTED
Cocaine: NOT DETECTED
Opiates: NOT DETECTED
TETRAHYDROCANNABINOL: POSITIVE — AB

## 2015-05-26 LAB — RAPID HIV SCREEN (HIV 1/2 AB+AG)
HIV 1/2 ANTIBODIES: NONREACTIVE
HIV-1 P24 ANTIGEN - HIV24: NONREACTIVE

## 2015-05-26 LAB — URINE MICROSCOPIC-ADD ON
Bacteria, UA: NONE SEEN
RBC / HPF: NONE SEEN RBC/hpf (ref 0–5)

## 2015-05-26 LAB — POC URINE PREG, ED: PREG TEST UR: NEGATIVE

## 2015-05-26 LAB — POC OCCULT BLOOD, ED: FECAL OCCULT BLD: NEGATIVE

## 2015-05-26 MED ORDER — AZITHROMYCIN 250 MG PO TABS
1000.0000 mg | ORAL_TABLET | Freq: Once | ORAL | Status: AC
  Administered 2015-05-26: 1000 mg via ORAL
  Filled 2015-05-26: qty 4

## 2015-05-26 MED ORDER — IOHEXOL 300 MG/ML  SOLN
25.0000 mL | Freq: Once | INTRAMUSCULAR | Status: DC | PRN
  Administered 2015-05-26: 25 mL via ORAL
  Filled 2015-05-26: qty 30

## 2015-05-26 MED ORDER — STERILE WATER FOR INJECTION IJ SOLN
INTRAMUSCULAR | Status: AC
  Administered 2015-05-26: 10 mL
  Filled 2015-05-26: qty 10

## 2015-05-26 MED ORDER — DOXYCYCLINE HYCLATE 100 MG PO CAPS: 100.0000 mg | ORAL_CAPSULE | Freq: Two times a day (BID) | ORAL | Status: AC

## 2015-05-26 MED ORDER — METRONIDAZOLE 500 MG PO TABS: 500.0000 mg | ORAL_TABLET | Freq: Two times a day (BID) | ORAL | Status: AC

## 2015-05-26 MED ORDER — GUAIFENESIN-CODEINE 100-10 MG/5ML PO SOLN: 5.0000 mL | Freq: Three times a day (TID) | ORAL | Status: AC | PRN

## 2015-05-26 MED ORDER — ALBUTEROL SULFATE HFA 108 (90 BASE) MCG/ACT IN AERS
1.0000 | INHALATION_SPRAY | RESPIRATORY_TRACT | Status: DC | PRN
  Administered 2015-05-26: 2 via RESPIRATORY_TRACT
  Filled 2015-05-26: qty 6.7

## 2015-05-26 MED ORDER — PREDNISONE 20 MG PO TABS
60.0000 mg | ORAL_TABLET | Freq: Once | ORAL | Status: AC
  Administered 2015-05-26: 60 mg via ORAL
  Filled 2015-05-26: qty 3

## 2015-05-26 MED ORDER — ONDANSETRON 4 MG PO TBDP
4.0000 mg | ORAL_TABLET | Freq: Once | ORAL | Status: AC
  Administered 2015-05-26: 4 mg via ORAL
  Filled 2015-05-26: qty 1

## 2015-05-26 MED ORDER — IPRATROPIUM-ALBUTEROL 0.5-2.5 (3) MG/3ML IN SOLN
3.0000 mL | Freq: Once | RESPIRATORY_TRACT | Status: AC
  Administered 2015-05-26: 3 mL via RESPIRATORY_TRACT
  Filled 2015-05-26: qty 3

## 2015-05-26 MED ORDER — CEFTRIAXONE SODIUM 250 MG IJ SOLR
250.0000 mg | Freq: Once | INTRAMUSCULAR | Status: AC
  Administered 2015-05-26: 250 mg via INTRAMUSCULAR
  Filled 2015-05-26: qty 250

## 2015-05-26 MED ORDER — FLUCONAZOLE 100 MG PO TABS
200.0000 mg | ORAL_TABLET | Freq: Once | ORAL | Status: AC
  Administered 2015-05-26: 200 mg via ORAL
  Filled 2015-05-26: qty 2

## 2015-05-26 MED ORDER — PREDNISONE 20 MG PO TABS: 40.0000 mg | ORAL_TABLET | Freq: Every day | ORAL | Status: AC

## 2015-05-26 MED ORDER — DOXYCYCLINE HYCLATE 100 MG PO TABS
100.0000 mg | ORAL_TABLET | Freq: Once | ORAL | Status: AC
  Administered 2015-05-26: 100 mg via ORAL
  Filled 2015-05-26: qty 1

## 2015-05-26 MED ORDER — ONDANSETRON 4 MG PO TBDP: 4.0000 mg | ORAL_TABLET | Freq: Three times a day (TID) | ORAL | Status: AC | PRN

## 2015-05-26 MED ORDER — METRONIDAZOLE 500 MG PO TABS
500.0000 mg | ORAL_TABLET | Freq: Once | ORAL | Status: AC
  Administered 2015-05-26: 500 mg via ORAL
  Filled 2015-05-26: qty 1

## 2015-05-26 MED ORDER — IOHEXOL 300 MG/ML  SOLN
100.0000 mL | Freq: Once | INTRAMUSCULAR | Status: AC | PRN
  Administered 2015-05-26: 100 mL via INTRAVENOUS

## 2015-05-26 MED ORDER — HYDROCODONE-ACETAMINOPHEN 5-325 MG PO TABS
1.0000 | ORAL_TABLET | Freq: Once | ORAL | Status: DC
  Filled 2015-05-26: qty 1

## 2015-05-26 NOTE — ED Provider Notes (Signed)
CSN: [id number]     Arrival date & time 05/26/15  1039 History   First MD Initiated Contact with Patient 05/26/15 1234     Chief Complaint  Patient presents with  . Exposure to STD  . Rectal Bleeding  . Cough     (Consider location/radiation/quality/duration/timing/severity/associated sxs/prior Treatment) HPI   Pt is a 35 y/o female, current smoker (~15 pack-year hx), she presents to the emergency room with multiple complaints including cough, back pain, abdominal pain, BRBPR, vaginal discharge, with LLQ pain with sex and rashes.  1.  Cough - patient's is a current smoker she states she smokes 4-5 cigarettes per day ever since she was 12. She states that approximately one month ago she smoked crack cocaine, since then she has had a cough with shortness of breath and wheeze. She states that last night she was much more short of breath when lying flat. She states she has frequent hot and cold chills with sweats but no fever. She does have clear sputum, she denies hemoptysis. She denies recent travel.   She denies lower extremity edema, palpitations, chest pain  2.  Back pain -  she states that she has had right lower back pain for 3 months it's been intermittent, consistent with sciatica that she has had in the past. She denies any numbness, tingling, weakness, incontinence, saddle anesthesia, denies IVDU  3.  Abdominal pain - patient complains of left lower quadrant abdominal pain that has also been intermittent, described as "gas pains" and sharp. She states that she has had intermittent diarrhea, she denies constipation but she is concerned that the past 2 days and have a bowel movement she states that she is wiping large amount of bright red blood, "as if she is having her period out her butt."  She denies any hemorrhoids or rectal pain. She denies any blood clots or melena.  She further denies nausea, vomiting.    4.  GU complaints - the patient complains of dysuria, incontinence when she  coughs, a red itchy rash to her groin. 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 She currently is sexually active with one partner, he is safely active with multiple other partners.  She is [id number], states she had one normal vaginal delivery, one ectopic pregnancy in her right fallopian tube, 2 emergency C-sections.  She states she has a long history of heavy periods with cramping. She complains of worsening metromenorrhagia. 5 years ago she had a Essure procedure.  She also states "I had cancer at one point", she describes as a cervical cancer which was removed by a cone biopsy.  She has never followed up after this.  She also complains of dyspareunia, vaginal discharge    Past Medical History  Diagnosis Date  . Obesity   . Hiatal hernia   . Bipolar 1 disorder (HCC)    History reviewed. No pertinent past surgical history. History reviewed. No pertinent family history. Social History  Substance Use Topics  . Smoking status: Current Every Day Smoker    Types: Cigarettes  . Smokeless tobacco: None  . Alcohol Use: Yes     Comment: occ   OB History    No data available     Review of Systems  All other systems reviewed and are negative.     Allergies  Review of patient's allergies indicates no known allergies.  Home Medications   Prior to Admission medications   Medication  Sig Start Date End Date Taking? Authorizing Provider  Pseudoeph-Doxylamine-DM-APAP (NYQUIL MULTI-SYMPTOM PO) Take 15 mLs by mouth 2 (two) times daily.   Yes Historical Provider, MD  doxycycline (VIBRAMYCIN) 100 MG capsule Take 1 capsule (100 mg total) by mouth 2 (two) times daily. 05/26/15 06/09/15  Danelle Berry, PA-C  guaiFENesin-codeine 100-10 MG/5ML syrup Take 5 mLs by mouth 3 (three) times daily as needed for cough. 05/26/15   Danelle Berry, PA-C  metroNIDAZOLE (FLAGYL) 500 MG tablet Take 1 tablet (500 mg total) by mouth 2 (two) times daily. 05/26/15   Danelle Berry, PA-C  ondansetron (ZOFRAN ODT) 4 MG disintegrating tablet Take 1 tablet (4 mg total) by mouth every 8 (eight) hours as needed for nausea or vomiting. 05/26/15   Danelle Berry, PA-C  predniSONE (DELTASONE) 20 MG tablet Take 2 tablets (40 mg total) by mouth daily. 05/26/15   Danelle Berry, PA-C   BP 118/57 mmHg  Pulse 80  Temp(Src) 98.2 F (36.8 C) (Oral)  Resp 16  SpO2 100%  LMP 05/07/2015 Physical Exam  Constitutional: She is oriented to person, place, and time. She appears well-developed and well-nourished. No distress.  HENT:  Head: Normocephalic and atraumatic.  Nose: Nose normal.  Mouth/Throat: Oropharynx is clear and moist. No oropharyngeal exudate.  Eyes: Conjunctivae and EOM are normal. Pupils are equal, round, and reactive to light. Right eye exhibits no discharge. Left eye exhibits no discharge. No scleral icterus.  Neck: Normal range of motion. No JVD present. No tracheal deviation present. No thyromegaly present.  Cardiovascular: Normal rate, regular rhythm, normal heart sounds and intact distal pulses.  Exam reveals no gallop and no friction rub.   No murmur heard. Pulmonary/Chest: Effort normal. No respiratory distress. She has wheezes. She has no rales. She exhibits no tenderness.  Abdominal: Soft. Bowel sounds are normal. She exhibits no distension and no mass. There is tenderness. There is guarding. There is no rebound.  Musculoskeletal: Normal range of motion. She exhibits no edema or tenderness.  Lymphadenopathy:    She has no cervical adenopathy.  Neurological: She is alert and oriented to person, place, and time. She has normal reflexes. No cranial nerve deficit. She exhibits normal muscle tone. Coordination normal.  Skin: Skin is warm and dry. Rash noted. She is not diaphoretic. No erythema. No pallor.  Psychiatric: She has a normal mood and affect. Her behavior is normal. Judgment and thought content normal.  Nursing note and vitals reviewed.   ED Course   Procedures (including critical care time) Labs Review Labs Reviewed  URINALYSIS, ROUTINE W REFLEX MICROSCOPIC (NOT AT [facility]) - Abnormal; Notable for the following:    APPearance CLOUDY (*)    Leukocytes, UA SMALL (*)    All other components within normal limits  CBC WITH DIFFERENTIAL/PLATELET - Abnormal; Notable for the following:    MCH 25.4 (*)    All other components within normal limits  URINE RAPID DRUG SCREEN, HOSP PERFORMED - Abnormal; Notable for the following:    Tetrahydrocannabinol POSITIVE (*)    All other components within normal limits  URINE MICROSCOPIC-ADD ON - Abnormal; Notable for the following:    Squamous Epithelial / LPF 0-5 (*)    All other components within normal limits  WET PREP, GENITAL  RAPID HIV SCREEN (HIV 1/2 AB+AG)  COMPREHENSIVE METABOLIC PANEL  RPR  HIV ANTIBODY (ROUTINE TESTING)  POC OCCULT BLOOD, ED  POC URINE PREG, ED  GC/CHLAMYDIA PROBE AMP ([place]) NOT AT [facility]    Imaging Review Dg  Chest 2 View  05/26/2015  CLINICAL DATA:  Pt c/o congestion, productive cough, and cold chills x 1 month. No hx of heart or lung problems. Pt is a current smoker. EXAM: CHEST  2 VIEW COMPARISON:  None. FINDINGS: Heart size is normal. There is perihilar peribronchial thickening. Prominent interstitial markings and somewhat nodular appearance of the interstitium is noted. There are no focal consolidations or pleural effusions. Surgical clips are noted in the upper abdomen. Visualized osseous structures have a normal appearance. IMPRESSION: 1. Reticular nodular opacities throughout the lungs. 2. Consider further evaluation with CT exam. Electronically Signed   By: Norva Pavlov M.D.   On: 05/26/2015 12:31   Ct Chest W Contrast  05/26/2015  CLINICAL DATA:  Rectal bleeding for 3 days, cough and shortness of breath EXAM: CT CHEST, ABDOMEN, AND PELVIS WITH CONTRAST TECHNIQUE: Multidetector CT imaging of the chest, abdomen and pelvis was performed following the standard  protocol during bolus administration of intravenous contrast. CONTRAST:  OMNIPAQUE IOHEXOL 300 MG/ML  SOLN COMPARISON:  None. FINDINGS: CT CHEST FINDINGS Mediastinum/Lymph Nodes: Multiple calcified hilar and mediastinal lymph nodes are noted. The aorta and pulmonary artery are within normal limits. The thoracic inlet is unremarkable. Lungs/Pleura: The lungs are well aerated bilaterally. No focal infiltrate or sizable effusion is seen. Multiple calcified granulomas are noted throughout both lungs. Musculoskeletal: No acute abnormality noted. CT ABDOMEN PELVIS FINDINGS Hepatobiliary: The gallbladder has been surgically removed. The liver is unremarkable. Pancreas: Within normal limits. Spleen: Within normal limits. Adrenals/Urinary Tract: Within normal limits. Stomach/Bowel: The appendix is within normal limits. No acute abnormality is noted. Vascular/Lymphatic: Within normal limits. Reproductive: Essure coils are noted within the fallopian tubes bilaterally. Musculoskeletal: No acute bony abnormality noted. IMPRESSION: Changes consistent with prior granulomatous disease. No acute abnormality is noted. Electronically Signed   By: Alcide Clever M.D.   On: 05/26/2015 17:28   US Transvaginal Non-ob  05/26/2015  CLINICAL DATA:  Pain EXAM: TRANSABDOMINAL AND TRANSVAGINAL ULTRASOUND OF PELVIS DOPPLER ULTRASOUND OF OVARIES TECHNIQUE: Both transabdominal and transvaginal ultrasound examinations of the pelvis were performed. Transabdominal technique was performed for global imaging of the pelvis including uterus, ovaries, adnexal regions, and pelvic cul-de-sac. It was necessary to proceed with endovaginal exam following the transabdominal exam to visualize the ovaries. Color and duplex Doppler ultrasound was utilized to evaluate blood flow to the ovaries. COMPARISON:  None. FINDINGS: Uterus Measurements: 8.3 x 4.4 x 5.2 cm. No fibroids or other mass visualized. Endometrium Thickness: 12 mm likely related to the  patient's current menstrual status. Right ovary Measurements: 2.3 x 1.3 x 1.4 cm. Normal appearance/no adnexal mass. Left ovary Measurements: 4.5 x 2.9 x 2.3 cm. Normal appearance/no adnexal mass. Pulsed Doppler evaluation of both ovaries demonstrates normal low-resistance arterial and venous waveforms. Other findings No abnormal free fluid. IMPRESSION: No acute abnormality noted. Electronically Signed   By: Alcide Clever M.D.   On: 05/26/2015 15:07   US Pelvis Complete  05/26/2015  CLINICAL DATA:  Pain EXAM: TRANSABDOMINAL AND TRANSVAGINAL ULTRASOUND OF PELVIS DOPPLER ULTRASOUND OF OVARIES TECHNIQUE: Both transabdominal and transvaginal ultrasound examinations of the pelvis were performed. Transabdominal technique was performed for global imaging of the pelvis including uterus, ovaries, adnexal regions, and pelvic cul-de-sac. It was necessary to proceed with endovaginal exam following the transabdominal exam to visualize the ovaries. Color and duplex Doppler ultrasound was utilized to evaluate blood flow to the ovaries. COMPARISON:  None. FINDINGS: Uterus Measurements: 8.3 x 4.4 x 5.2 cm. No fibroids or other mass  visualized. Endometrium Thickness: 12 mm likely related to the patient's current menstrual status. Right ovary Measurements: 2.3 x 1.3 x 1.4 cm. Normal appearance/no adnexal mass. Left ovary Measurements: 4.5 x 2.9 x 2.3 cm. Normal appearance/no adnexal mass. Pulsed Doppler evaluation of both ovaries demonstrates normal low-resistance arterial and venous waveforms. Other findings No abnormal free fluid. IMPRESSION: No acute abnormality noted. Electronically Signed   By: Alcide Clever M.D.   On: 05/26/2015 15:07   Ct Abdomen Pelvis W Contrast  05/26/2015  CLINICAL DATA:  Rectal bleeding for 3 days, cough and shortness of breath EXAM: CT CHEST, ABDOMEN, AND PELVIS WITH CONTRAST TECHNIQUE: Multidetector CT imaging of the chest, abdomen and pelvis was performed following the standard protocol during bolus  administration of intravenous contrast. CONTRAST:  OMNIPAQUE IOHEXOL 300 MG/ML  SOLN COMPARISON:  None. FINDINGS: CT CHEST FINDINGS Mediastinum/Lymph Nodes: Multiple calcified hilar and mediastinal lymph nodes are noted. The aorta and pulmonary artery are within normal limits. The thoracic inlet is unremarkable. Lungs/Pleura: The lungs are well aerated bilaterally. No focal infiltrate or sizable effusion is seen. Multiple calcified granulomas are noted throughout both lungs. Musculoskeletal: No acute abnormality noted. CT ABDOMEN PELVIS FINDINGS Hepatobiliary: The gallbladder has been surgically removed. The liver is unremarkable. Pancreas: Within normal limits. Spleen: Within normal limits. Adrenals/Urinary Tract: Within normal limits. Stomach/Bowel: The appendix is within normal limits. No acute abnormality is noted. Vascular/Lymphatic: Within normal limits. Reproductive: Essure coils are noted within the fallopian tubes bilaterally. Musculoskeletal: No acute bony abnormality noted. IMPRESSION: Changes consistent with prior granulomatous disease. No acute abnormality is noted. Electronically Signed   By: Alcide Clever M.D.   On: 05/26/2015 17:28   [place] Art/ven Flow Abd Pelv Doppler  05/26/2015  CLINICAL DATA:  Pain EXAM: TRANSABDOMINAL AND TRANSVAGINAL ULTRASOUND OF PELVIS DOPPLER ULTRASOUND OF OVARIES TECHNIQUE: Both transabdominal and transvaginal ultrasound examinations of the pelvis were performed. Transabdominal technique was performed for global imaging of the pelvis including uterus, ovaries, adnexal regions, and pelvic cul-de-sac. It was necessary to proceed with endovaginal exam following the transabdominal exam to visualize the ovaries. Color and duplex Doppler ultrasound was utilized to evaluate blood flow to the ovaries. COMPARISON:  None. FINDINGS: Uterus Measurements: 8.3 x 4.4 x 5.2 cm. No fibroids or other mass visualized. Endometrium Thickness: 12 mm likely related to the patient's current  menstrual status. Right ovary Measurements: 2.3 x 1.3 x 1.4 cm. Normal appearance/no adnexal mass. Left ovary Measurements: 4.5 x 2.9 x 2.3 cm. Normal appearance/no adnexal mass. Pulsed Doppler evaluation of both ovaries demonstrates normal low-resistance arterial and venous waveforms. Other findings No abnormal free fluid. IMPRESSION: No acute abnormality noted. Electronically Signed   By: Alcide Clever M.D.   On: 05/26/2015 15:07   I have personally reviewed and evaluated these images and lab results as part of my medical decision-making.   EKG Interpretation None      MDM   Patient was worked up for multiple complaints. At the time of my evaluation she had an abnormal chest x-ray which suggested CT scan of her chest. Vital signs are stable and she was nontoxic in appearance.  She had diffuse expiratory wheeze with her cough. Her abdomen was diffusely tender to palpation with guarding.  She complained of bright red blood per rectum and a history of GI ulcers, family history of Crohn's.    ER workup was extensive due to multiple complaints and risk factors such as extensive GU history, work as an Research officer, political party, drug use, current smoker.  Vaginal exam is pertinent for CMT and adnexal tenderness, will treat for PID.  Pelvic ultrasound showed a thickened endometrium which is consistent with her history of metromenorrhagia. Her ovaries were normal appearance, there is no free fluid, no torsion.  Patient was given one time treatment for STD exposure early on in her ER workup with IM Rocephin and 2 g of azithromycin. She will be discharged home with Flagyl and Doxy.  CT scan abdomen and pelvis and chest was done for abdominal tenderness, and regular chest x-ray. CT scan the chest reveals calcific hilar and mediastinal lymph nodes which is consistent with a prior granulomatous disease, scans were negative for any acute abnormality. Hemoccult was negative. Patient's hemoglobin and hematocrit was normal, do not  suspect acute blood loss for GI bleed. Digital rectal exam was performed there is no gross blood, no external hemorrhoids and no internal hemorrhoids palpated.    With a clear CT scan of the chest patient most likely has bronchitis with persistent cough and wheeze. Doxycycline will help cover the lungs, and she was given multiple breathing treatments and an inhaler in the ER. She'll discharge home with a steroid taper.  Case management with consult to help the patient establish follow-up. She was instructed to go to [facility] for follow-up as needed with any acute or worrisome vaginal complaints.  Patient has been treated worked up extensively without emergent medical condition that would necessitate admission. Her vital signs have been stable. She will be discharged with multiple treatments as described above. She was discharged in good condition, return precautions reviewed.  Medications  iohexol (OMNIPAQUE) 300 MG/ML solution 25 mL (25 mLs Oral Contrast Given 05/26/15 1527)  albuterol (PROVENTIL HFA;VENTOLIN HFA) 108 (90 Base) MCG/ACT inhaler 1-2 puff (2 puffs Inhalation Given 05/26/15 1752)  cefTRIAXone (ROCEPHIN) injection 250 mg (250 mg Intramuscular Given 05/26/15 1405)  azithromycin (ZITHROMAX) tablet 1,000 mg (1,000 mg Oral Given 05/26/15 1359)  sterile water (preservative free) injection (10 mLs  Given 05/26/15 1400)  iohexol (OMNIPAQUE) 300 MG/ML solution 100 mL (100 mLs Intravenous Contrast Given 05/26/15 1614)  doxycycline (VIBRA-TABS) tablet 100 mg (100 mg Oral Given 05/26/15 1717)  metroNIDAZOLE (FLAGYL) tablet 500 mg (500 mg Oral Given 05/26/15 1717)  ondansetron (ZOFRAN-ODT) disintegrating tablet 4 mg (4 mg Oral Given 05/26/15 1717)  ipratropium-albuterol (DUONEB) 0.5-2.5 (3) MG/3ML nebulizer solution 3 mL (3 mLs Nebulization Given 05/26/15 1752)  predniSONE (DELTASONE) tablet 60 mg (60 mg Oral Given 05/26/15 1752)      Final diagnoses:  PID (acute pelvic inflammatory disease)   Acute bronchitis, unspecified organism        Danelle Berry, PA-C 06/03/15 0223  Courteney Lyn Mackuen, MD 06/03/15 [phone]

## 2015-05-26 NOTE — ED Notes (Signed)
Patient transported to Ultrasound 

## 2015-05-26 NOTE — ED Notes (Signed)
Pt has multiple complaints. Reports recent chest cough and congestion. Also began having rectal bleeding this am and wants to be checked for std due to recent exposure.

## 2015-05-26 NOTE — Discharge Instructions (Signed)
Acute Bronchitis Bronchitis is inflammation of the airways that extend from the windpipe into the lungs (bronchi). The inflammation often causes mucus to develop. This leads to a cough, which is the most common symptom of bronchitis.  In acute bronchitis, the condition usually develops suddenly and goes away over time, usually in a couple weeks. Smoking, allergies, and asthma can make bronchitis worse. Repeated episodes of bronchitis may cause further lung problems.  CAUSES Acute bronchitis is most often caused by the same virus that causes a cold. The virus can spread from person to person (contagious) through coughing, sneezing, and touching contaminated objects. SIGNS AND SYMPTOMS   Cough.   Fever.   Coughing up mucus.   Body aches.   Chest congestion.   Chills.   Shortness of breath.   Sore throat.  DIAGNOSIS  Acute bronchitis is usually diagnosed through a physical exam. Your health care provider will also ask you questions about your medical history. Tests, such as chest X-rays, are sometimes done to rule out other conditions.  TREATMENT  Acute bronchitis usually goes away in a couple weeks. Oftentimes, no medical treatment is necessary. Medicines are sometimes given for relief of fever or cough. Antibiotic medicines are usually not needed but may be prescribed in certain situations. In some cases, an inhaler may be recommended to help reduce shortness of breath and control the cough. A cool mist vaporizer may also be used to help thin bronchial secretions and make it easier to clear the chest.  HOME CARE INSTRUCTIONS  Get plenty of rest.   Drink enough fluids to keep your urine clear or pale yellow (unless you have a medical condition that requires fluid restriction). Increasing fluids may help thin your respiratory secretions (sputum) and reduce chest congestion, and it will prevent dehydration.   Take medicines only as directed by your health care provider.  If  you were prescribed an antibiotic medicine, finish it all even if you start to feel better.  Avoid smoking and secondhand smoke. Exposure to cigarette smoke or irritating chemicals will make bronchitis worse. If you are a smoker, consider using nicotine gum or skin patches to help control withdrawal symptoms. Quitting smoking will help your lungs heal faster.   Reduce the chances of another bout of acute bronchitis by washing your hands frequently, avoiding people with cold symptoms, and trying not to touch your hands to your mouth, nose, or eyes.   Keep all follow-up visits as directed by your health care provider.  SEEK MEDICAL CARE IF: Your symptoms do not improve after 1 week of treatment.  SEEK IMMEDIATE MEDICAL CARE IF:  You develop an increased fever or chills.   You have chest pain.   You have severe shortness of breath.  You have bloody sputum.   You develop dehydration.  You faint or repeatedly feel like you are going to pass out.  You develop repeated vomiting.  You develop a severe headache. MAKE SURE YOU:   Understand these instructions.  Will watch your condition.  Will get help right away if you are not doing well or get worse.   This information is not intended to replace advice given to you by your health care provider. Make sure you discuss any questions you have with your health care provider.   Document Released: 04/02/2004 Document Revised: 03/16/2014 Document Reviewed: 08/16/2012 Elsevier Interactive Patient Education 2016 [employer]  Pelvic Inflammatory Disease Pelvic inflammatory disease (PID) refers to an infection in some or all of the female  organs. The infection can be in the uterus, ovaries, fallopian tubes, or the surrounding tissues in the pelvis. PID can cause abdominal or pelvic pain that comes on suddenly (acute pelvic pain). PID is a serious infection because it can lead to lasting (chronic) pelvic pain or the inability to have  children (infertility). CAUSES This condition is most often caused by an infection that is spread during sexual contact. However, the infection can also be caused by the normal bacteria that are found in the vaginal tissues if these bacteria travel upward into the reproductive organs. PID can also occur following:  The birth of a baby.  A miscarriage.  An abortion.  Major pelvic surgery.  The use of an intrauterine device (IUD).  A sexual assault. RISK FACTORS This condition is more likely to develop in women who:  Are younger than 36 years of age.  [facility].  Use nonbarrier contraception.  Have multiple sexual partners.  Have sex with someone who has symptoms of an STD (sexually transmitted disease).  Use oral contraception. At times, certain behaviors can also increase the possibility of getting PID, such as:  Using a vaginal douche.  Having an IUD in place. SYMPTOMS Symptoms of this condition include:  Abdominal or pelvic pain.  Fever.  Chills.  Abnormal vaginal discharge.  Abnormal uterine bleeding.  Unusual pain shortly after the end of a menstrual period.  Painful urination.  Pain with sexual intercourse.  Nausea and vomiting. DIAGNOSIS To diagnose this condition, your health care provider will do a physical exam and take your medical history. A pelvic exam typically reveals great tenderness in the uterus and the surrounding pelvic tissues. You may also have tests, such as:  Lab tests, including a pregnancy test, blood tests, and urine test.  Culture tests of the vagina and cervix to check for an STD.  Ultrasound.  A laparoscopic procedure to look inside the pelvis.  Examining vaginal secretions under a microscope. TREATMENT Treatment for this condition may involve one or more approaches.  Antibiotic medicines may be prescribed to be taken by mouth.  Sexual partners may need to be treated if the infection is caused  by an STD.  For more severe cases, hospitalization may be needed to give antibiotics directly into a vein through an IV tube.  Surgery may be needed if other treatments do not help, but this is rare. It may take weeks until you are completely well. If you are diagnosed with PID, you should also be checked for human immunodeficiency virus (HIV). Your health care provider may test you for infection again 3 months after treatment. You should not have unprotected sex. HOME CARE INSTRUCTIONS  Take over-the-counter and prescription medicines only as told by your health care provider.  If you were prescribed an antibiotic medicine, take it as told by your health care provider. Do not stop taking the antibiotic even if you start to feel better.  Do not have sexual intercourse until treatment is completed or as told by your health care provider. If PID is confirmed, your recent sexual partners will need treatment, especially if you had unprotected sex.  Keep all follow-up visits as told by your health care provider. This is important. SEEK MEDICAL CARE IF:  You have increased or abnormal vaginal discharge.  Your pain does not improve.  You vomit.  You have a fever.  You cannot tolerate your medicines.  Your partner has an STD.  You have pain when you urinate.  SEEK IMMEDIATE MEDICAL CARE IF:  You have increased abdominal or pelvic pain.  You have chills.  Your symptoms are not better in 72 hours even with treatment.   This information is not intended to replace advice given to you by your health care provider. Make sure you discuss any questions you have with your health care provider.   Document Released: 02/23/2005 Document Revised: 11/14/2014 Document Reviewed: 04/02/2014 Elsevier Interactive Patient Education [employer]

## 2015-05-26 NOTE — ED Notes (Signed)
No answer x1

## 2015-05-27 LAB — GC/CHLAMYDIA PROBE AMP (~~LOC~~) NOT AT ARMC
Chlamydia: NEGATIVE
NEISSERIA GONORRHEA: NEGATIVE

## 2015-05-27 LAB — HIV ANTIBODY (ROUTINE TESTING W REFLEX): HIV SCREEN 4TH GENERATION: NONREACTIVE

## 2015-05-27 LAB — RPR: RPR: NONREACTIVE

## 2017-03-19 IMAGING — CT CT CHEST W/ CM
1 of 4 series · 16 of 46 positions shown, 18 images · IV contrast (omnipaque)
Comparison: None.

CLINICAL DATA: Rectal bleeding for 3 days, cough and shortness of
breath

EXAM:
CT CHEST, ABDOMEN, AND PELVIS WITH CONTRAST
TECHNIQUE: Multidetector CT imaging of the chest, abdomen and pelvis was
performed following the standard protocol during bolus
administration of intravenous contrast.
CONTRAST:  100mL OMNIPAQUE IOHEXOL 300 MG/ML  SOLN

[Series 201: cap with, idose (2) · axial · 0.85mm/px · z∈[-661,-111]mm · 16 of 124 slices shown, 18 images]
[im 7/124  soft-tissue]
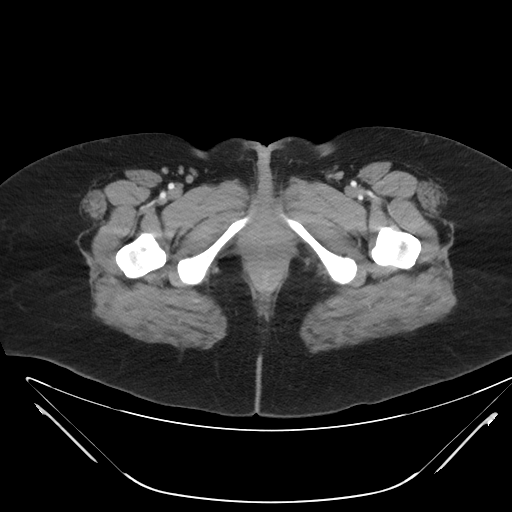
[im 7/124  bone]
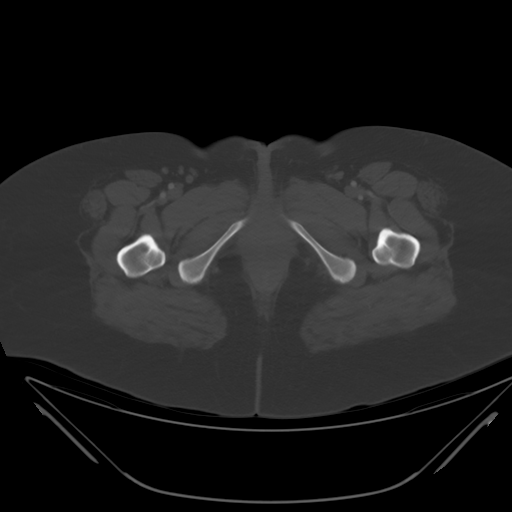
[im 13/124  soft-tissue]
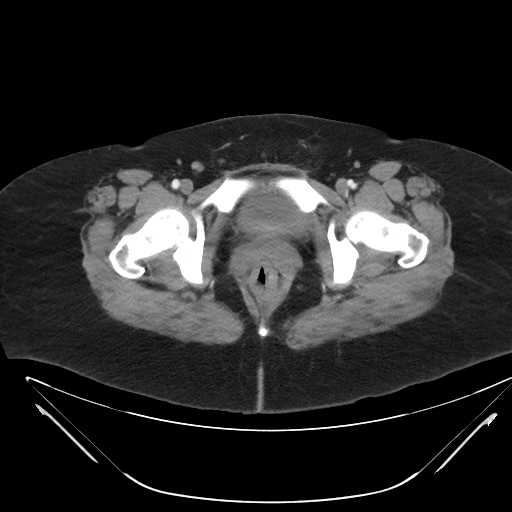
[im 19/124  soft-tissue]
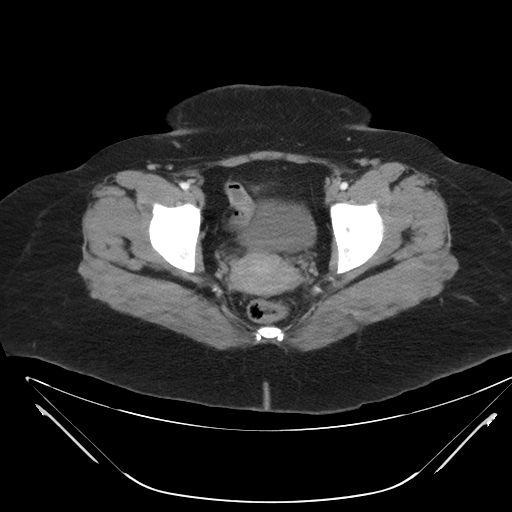
[im 31/124  soft-tissue]
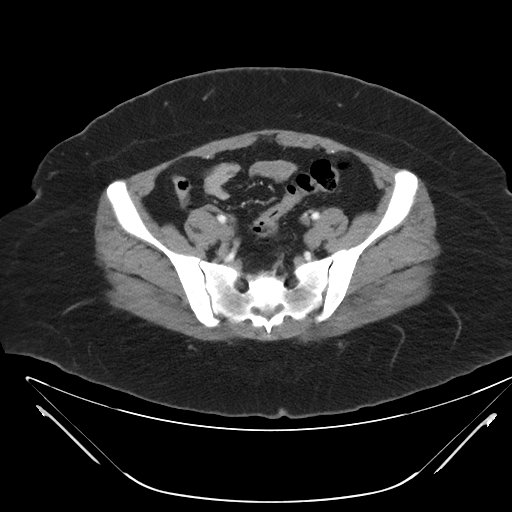
[im 37/124  soft-tissue]
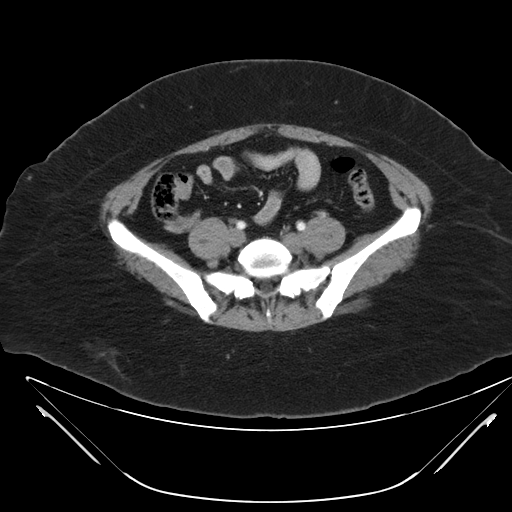
[im 44/124  soft-tissue]
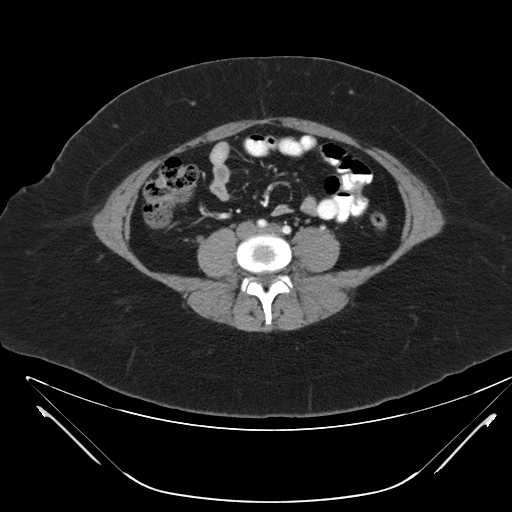
[im 50/124  soft-tissue]
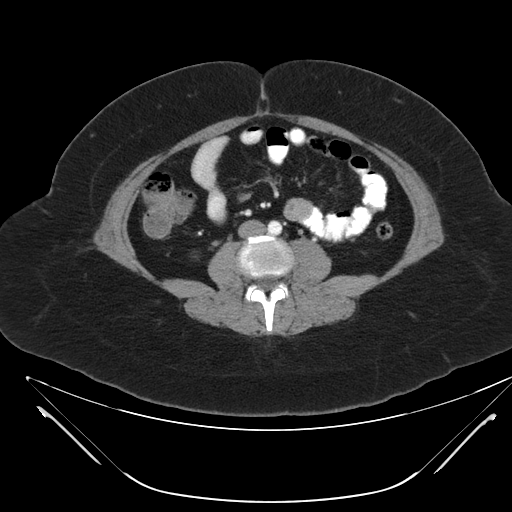
[im 56/124  soft-tissue]
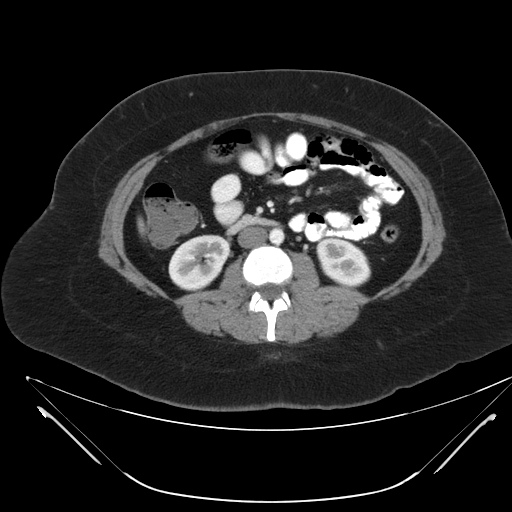
[im 68/124  soft-tissue]
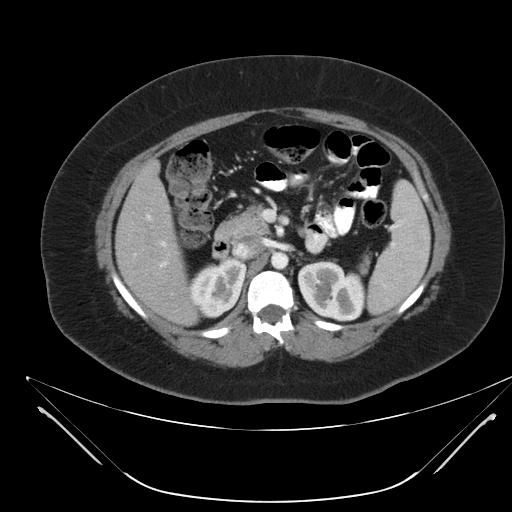
[im 68/124  bone]
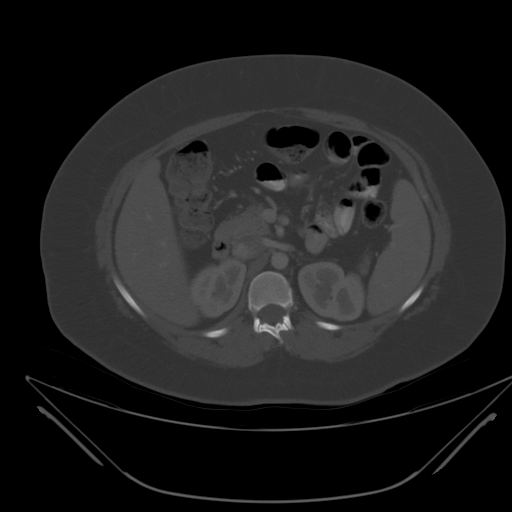
[im 74/124  soft-tissue]
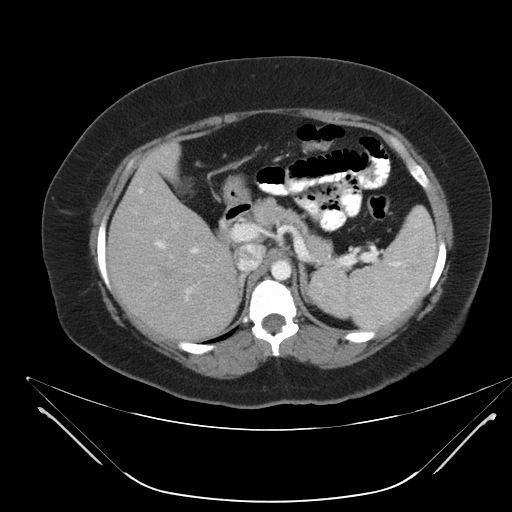
[im 80/124  soft-tissue]
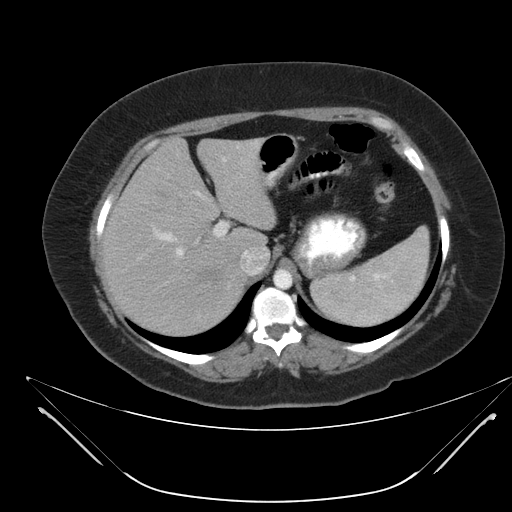
[im 87/124  soft-tissue]
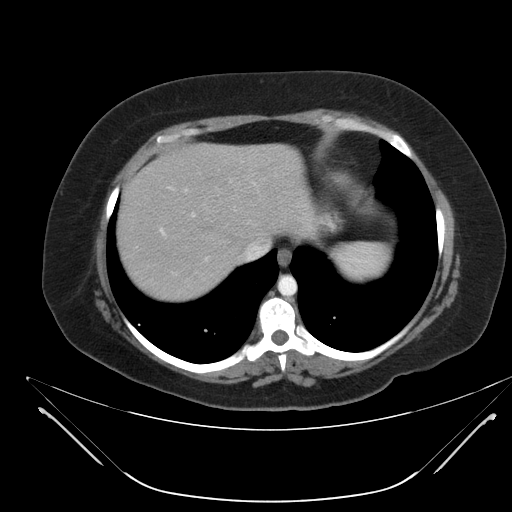
[im 93/124  soft-tissue]
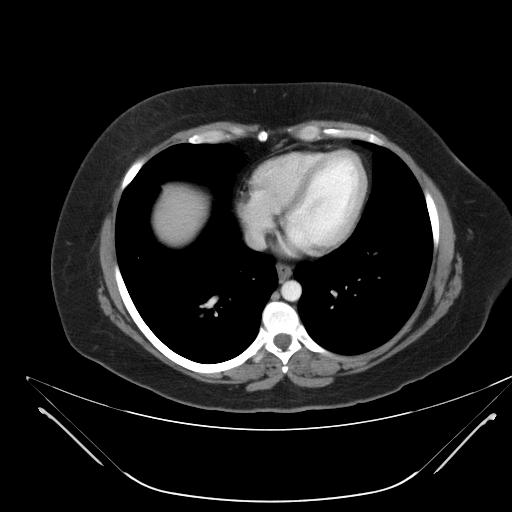
[im 105/124  soft-tissue]
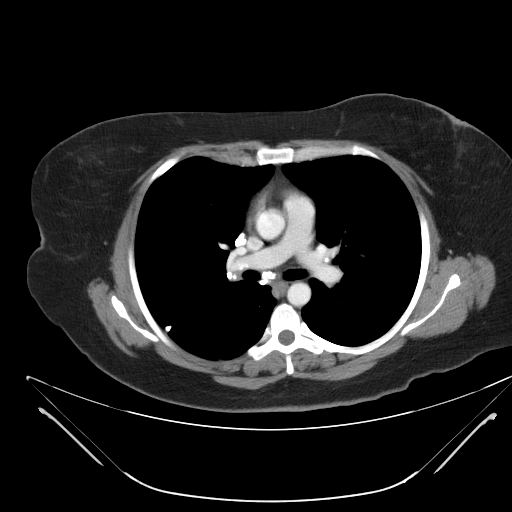
[im 111/124  soft-tissue]
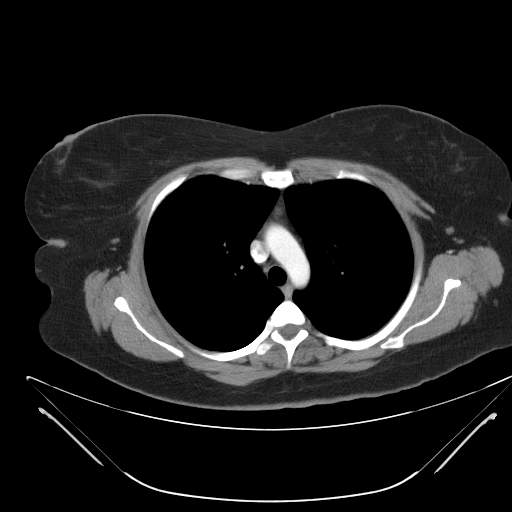
[im 117/124  soft-tissue]
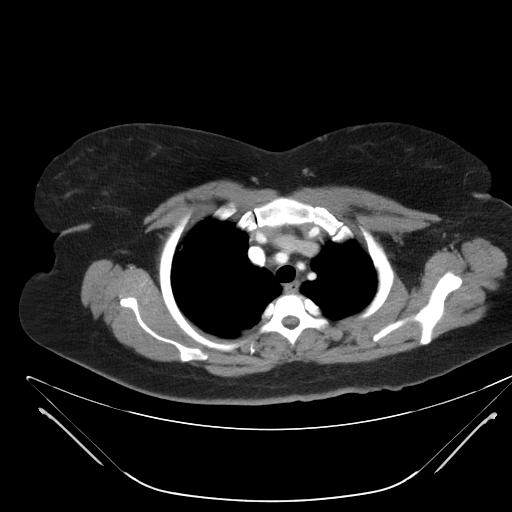

[16 of 46 positions shown; findings below may reference images not displayed]

FINDINGS: CT CHEST FINDINGS

Mediastinum/Lymph Nodes: Multiple calcified hilar and mediastinal
lymph nodes are noted. The aorta and pulmonary artery are within
normal limits. The thoracic inlet is unremarkable.

Lungs/Pleura: The lungs are well aerated bilaterally. No focal
infiltrate or sizable effusion is seen. Multiple calcified
granulomas are noted throughout both lungs.

Musculoskeletal: No acute abnormality noted.

CT ABDOMEN PELVIS FINDINGS

Hepatobiliary: The gallbladder has been surgically removed. The
liver is unremarkable.

Pancreas: Within normal limits.

Spleen: Within normal limits.

Adrenals/Urinary Tract: Within normal limits.

Stomach/Bowel: The appendix is within normal limits. No acute
abnormality is noted.

Vascular/Lymphatic: Within normal limits.

Reproductive: Essure coils are noted within the fallopian tubes
bilaterally.

Musculoskeletal: No acute bony abnormality noted.
IMPRESSION: Changes consistent with prior granulomatous disease. No acute
abnormality is noted.

## 2018-03-10 IMAGING — US US PELVIS COMPLETE
1 series · 14 of 25 positions shown · non-contrast
Comparison: None.

CLINICAL DATA: Pain

EXAM:
TRANSABDOMINAL AND TRANSVAGINAL ULTRASOUND OF PELVIS
DOPPLER ULTRASOUND OF OVARIES
TECHNIQUE: Both transabdominal and transvaginal ultrasound examinations of the
pelvis were performed. Transabdominal technique was performed for
global imaging of the pelvis including uterus, ovaries, adnexal
regions, and pelvic cul-de-sac.
It was necessary to proceed with endovaginal exam following the
transabdominal exam to visualize the ovaries. Color and duplex
Doppler ultrasound was utilized to evaluate blood flow to the
ovaries.

[Series 1: us pelvis complete · 0.21mm/px · 14 of 63 slices shown]
[im 1/63]
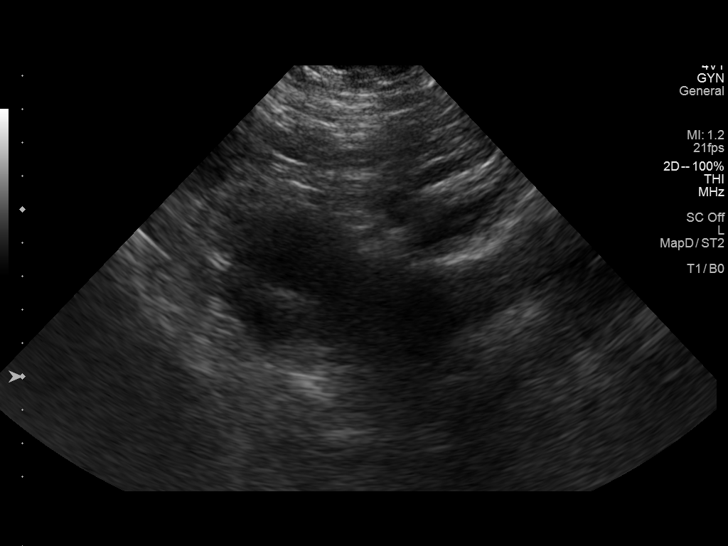
[im 6/63]
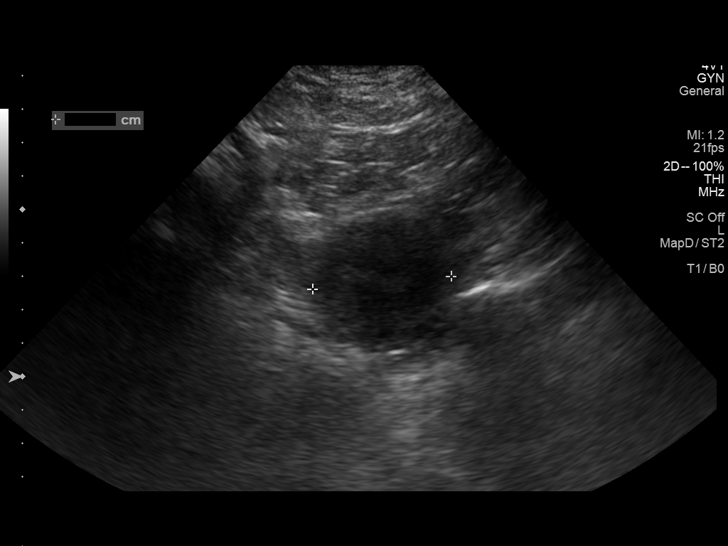
[im 11/63]
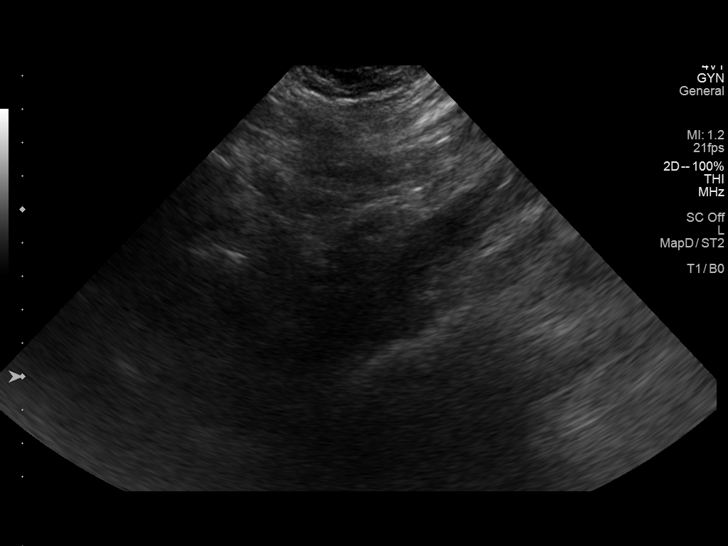
[im 16/63]
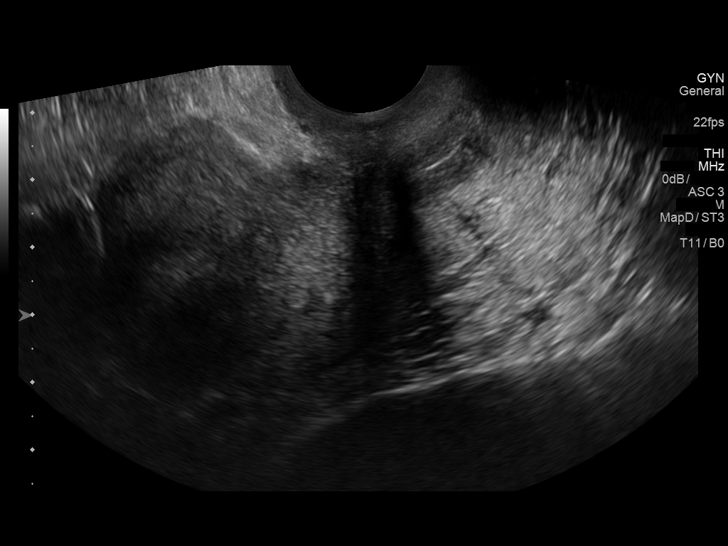
[im 21/63]
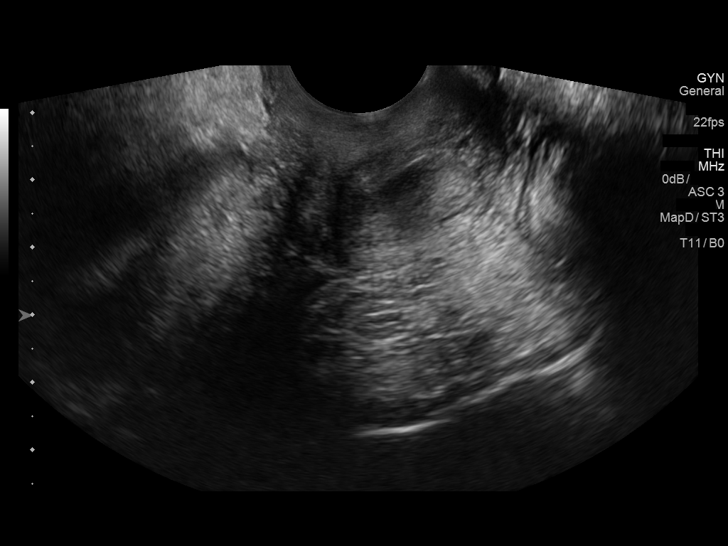
[im 24/63]
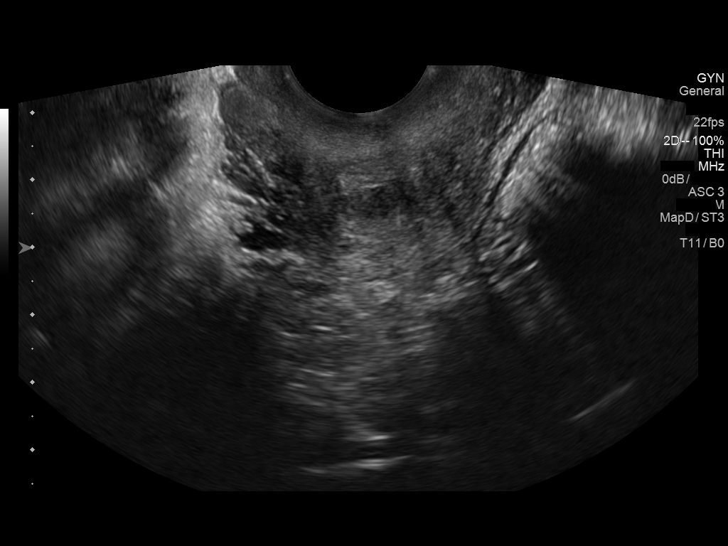
[im 29/63]
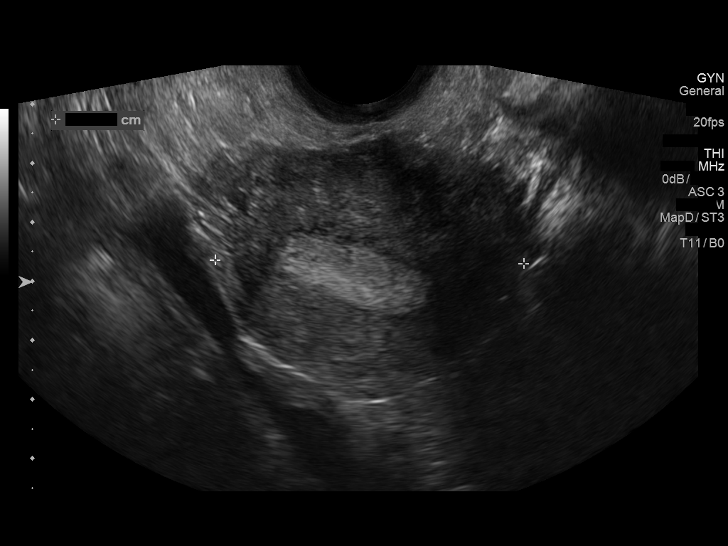
[im 34/63]
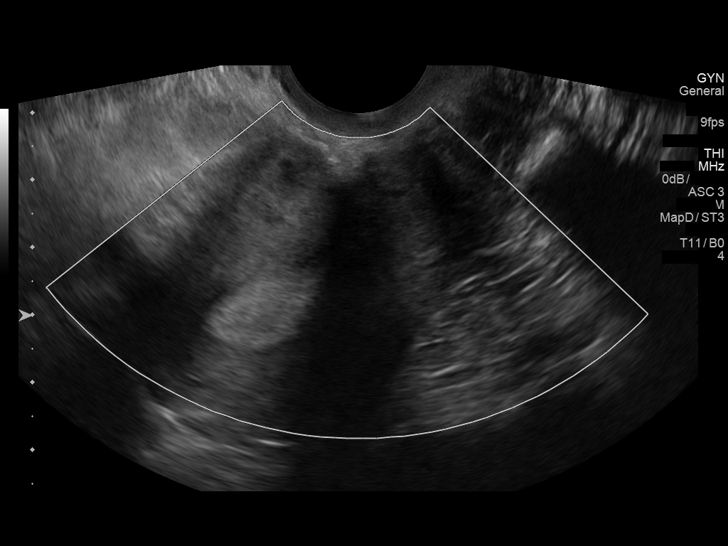
[im 39/63]
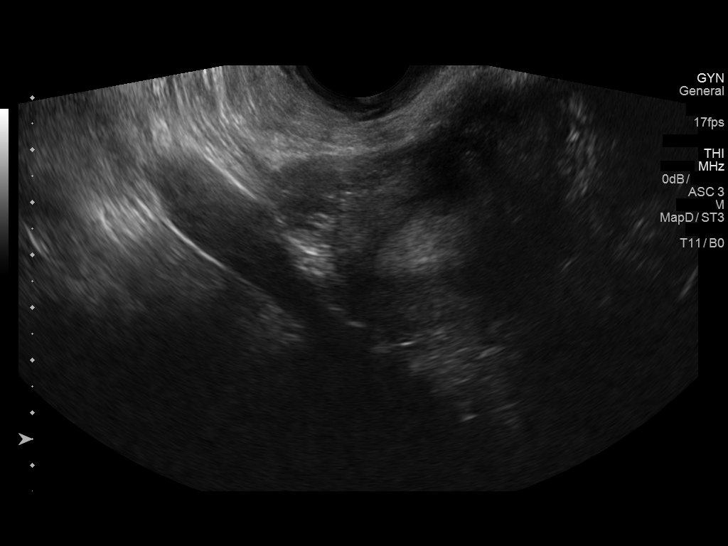
[im 42/63]
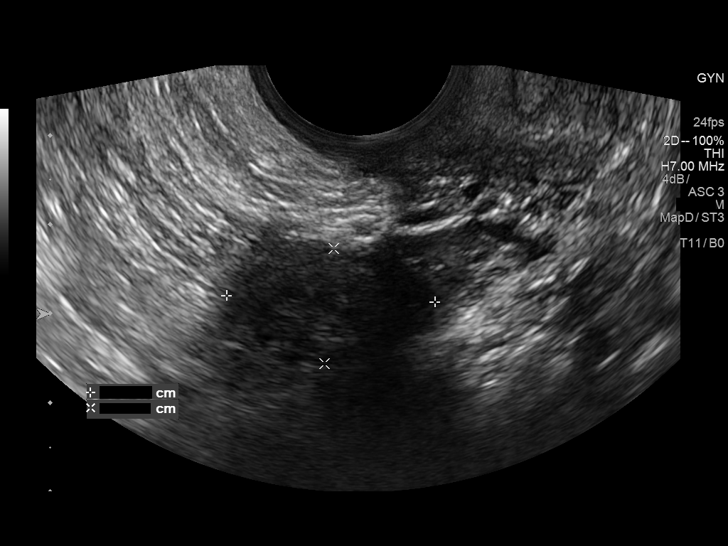
[im 47/63]
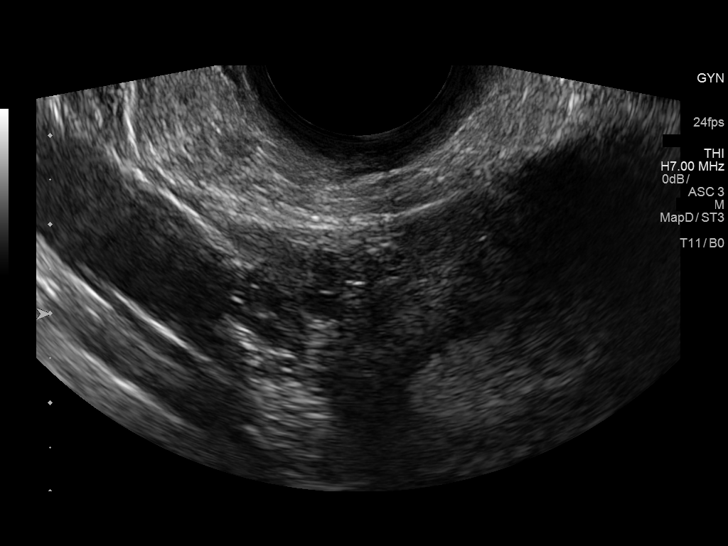
[im 52/63]
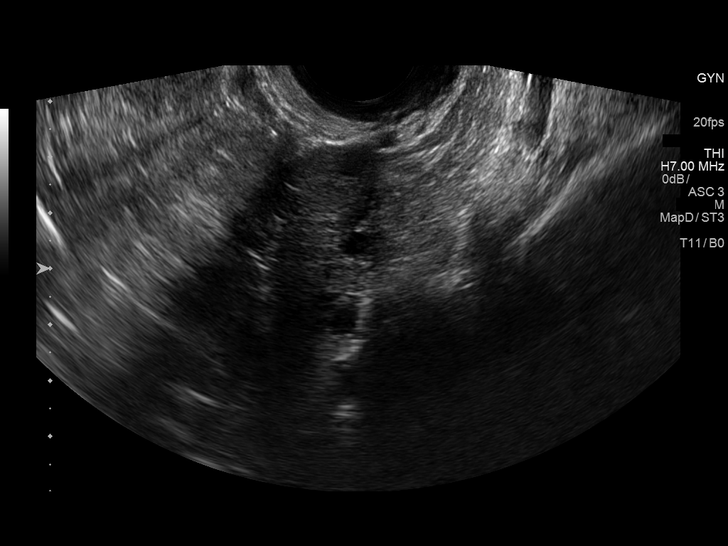
[im 57/63]
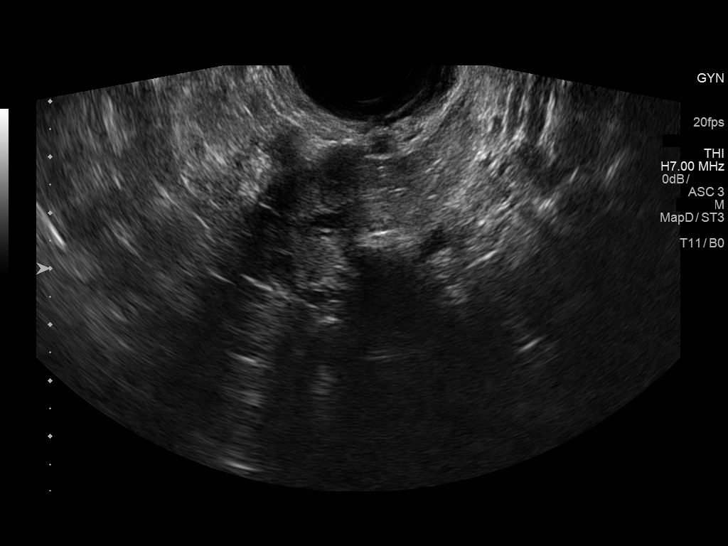
[im 63/63]
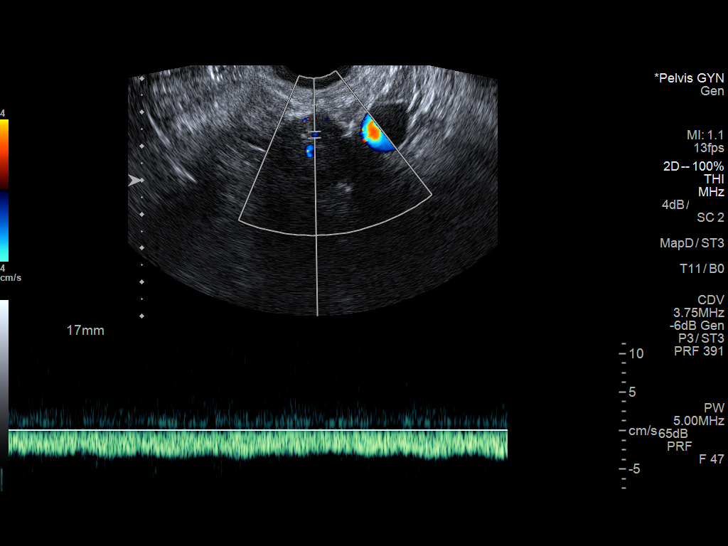

[14 of 25 positions shown; findings below may reference images not displayed]

FINDINGS: Uterus

Measurements: 8.3 x 4.4 x 5.2 cm.. No fibroids or other mass
visualized.

Endometrium

Thickness: 12 mm likely related to the patient's current menstrual
status..

Right ovary

Measurements: 2.3 x 1.3 x 1.4 cm.. Normal appearance/no adnexal
mass.

Left ovary

Measurements: 4.5 x 2.9 x 2.3 cm.. Normal appearance/no adnexal
mass.

Pulsed Doppler evaluation of both ovaries demonstrates normal
low-resistance arterial and venous waveforms.

Other findings

No abnormal free fluid.
IMPRESSION: No acute abnormality noted.
